# Patient Record
Sex: Male | Born: 1955 | Race: White | Hispanic: No | Marital: Married | Smoking: Never smoker
Health system: Southern US, Community
[De-identification: ages and names within clinical notes are randomized; demographics above are authoritative.]

## PROBLEM LIST (undated history)

## (undated) DIAGNOSIS — R112 Nausea with vomiting, unspecified: Secondary | ICD-10-CM

## (undated) DIAGNOSIS — Z9889 Other specified postprocedural states: Secondary | ICD-10-CM

## (undated) DIAGNOSIS — C801 Malignant (primary) neoplasm, unspecified: Secondary | ICD-10-CM

## (undated) DIAGNOSIS — J189 Pneumonia, unspecified organism: Secondary | ICD-10-CM

## (undated) DIAGNOSIS — F32A Depression, unspecified: Secondary | ICD-10-CM

## (undated) DIAGNOSIS — F329 Major depressive disorder, single episode, unspecified: Secondary | ICD-10-CM

## (undated) DIAGNOSIS — Z8489 Family history of other specified conditions: Secondary | ICD-10-CM

## (undated) DIAGNOSIS — E0590 Thyrotoxicosis, unspecified without thyrotoxic crisis or storm: Secondary | ICD-10-CM

## (undated) DIAGNOSIS — T4145XA Adverse effect of unspecified anesthetic, initial encounter: Secondary | ICD-10-CM

## (undated) DIAGNOSIS — T8859XA Other complications of anesthesia, initial encounter: Secondary | ICD-10-CM

## (undated) HISTORY — PX: RETINAL DETACHMENT SURGERY: SHX105

## (undated) HISTORY — PX: TONSILLECTOMY AND ADENOIDECTOMY: SUR1326

## (undated) HISTORY — DX: Depression, unspecified: F32.A

## (undated) HISTORY — PX: EYE SURGERY: SHX253

## (undated) HISTORY — PX: COLONOSCOPY: SHX174

## (undated) HISTORY — PX: WISDOM TOOTH EXTRACTION: SHX21

## (undated) HISTORY — DX: Thyrotoxicosis, unspecified without thyrotoxic crisis or storm: E05.90

## (undated) HISTORY — DX: Major depressive disorder, single episode, unspecified: F32.9

---

## 2000-02-04 ENCOUNTER — Encounter: Payer: Self-pay | Admitting: Internal Medicine

## 2000-02-04 ENCOUNTER — Ambulatory Visit (HOSPITAL_COMMUNITY): Admission: RE | Admit: 2000-02-04 | Discharge: 2000-02-04 | Payer: Self-pay | Admitting: Internal Medicine

## 2000-02-12 DIAGNOSIS — E049 Nontoxic goiter, unspecified: Secondary | ICD-10-CM | POA: Insufficient documentation

## 2000-03-25 ENCOUNTER — Encounter (INDEPENDENT_AMBULATORY_CARE_PROVIDER_SITE_OTHER): Payer: Self-pay | Admitting: Specialist

## 2000-03-25 ENCOUNTER — Ambulatory Visit (HOSPITAL_COMMUNITY): Admission: RE | Admit: 2000-03-25 | Discharge: 2000-03-25 | Payer: Self-pay | Admitting: Gastroenterology

## 2001-12-22 ENCOUNTER — Encounter: Admission: RE | Admit: 2001-12-22 | Discharge: 2001-12-22 | Payer: Self-pay | Admitting: Internal Medicine

## 2001-12-22 ENCOUNTER — Encounter: Payer: Self-pay | Admitting: Internal Medicine

## 2002-05-25 ENCOUNTER — Encounter: Payer: Self-pay | Admitting: Ophthalmology

## 2002-05-25 ENCOUNTER — Ambulatory Visit (HOSPITAL_COMMUNITY): Admission: RE | Admit: 2002-05-25 | Discharge: 2002-05-27 | Payer: Self-pay | Admitting: Ophthalmology

## 2004-02-22 ENCOUNTER — Ambulatory Visit: Payer: Self-pay | Admitting: Internal Medicine

## 2005-03-11 ENCOUNTER — Ambulatory Visit: Payer: Self-pay | Admitting: Internal Medicine

## 2005-03-19 ENCOUNTER — Encounter: Admission: RE | Admit: 2005-03-19 | Discharge: 2005-03-19 | Payer: Self-pay | Admitting: Internal Medicine

## 2005-04-20 ENCOUNTER — Ambulatory Visit: Payer: Self-pay | Admitting: Endocrinology

## 2005-04-20 ENCOUNTER — Other Ambulatory Visit: Admission: RE | Admit: 2005-04-20 | Discharge: 2005-04-20 | Payer: Self-pay | Admitting: Endocrinology

## 2005-04-20 ENCOUNTER — Encounter (INDEPENDENT_AMBULATORY_CARE_PROVIDER_SITE_OTHER): Payer: Self-pay | Admitting: *Deleted

## 2005-05-05 ENCOUNTER — Encounter (HOSPITAL_COMMUNITY): Admission: RE | Admit: 2005-05-05 | Discharge: 2005-07-21 | Payer: Self-pay | Admitting: Endocrinology

## 2005-05-14 ENCOUNTER — Ambulatory Visit: Payer: Self-pay | Admitting: Internal Medicine

## 2005-06-22 ENCOUNTER — Ambulatory Visit (HOSPITAL_COMMUNITY): Admission: RE | Admit: 2005-06-22 | Discharge: 2005-06-22 | Payer: Self-pay | Admitting: Endocrinology

## 2005-08-07 ENCOUNTER — Ambulatory Visit: Payer: Self-pay | Admitting: Endocrinology

## 2005-09-09 ENCOUNTER — Ambulatory Visit: Payer: Self-pay | Admitting: Endocrinology

## 2005-10-21 ENCOUNTER — Ambulatory Visit: Payer: Self-pay | Admitting: Endocrinology

## 2005-12-28 ENCOUNTER — Ambulatory Visit: Payer: Self-pay | Admitting: Internal Medicine

## 2006-01-11 ENCOUNTER — Ambulatory Visit (HOSPITAL_COMMUNITY): Admission: RE | Admit: 2006-01-11 | Discharge: 2006-01-11 | Payer: Self-pay | Admitting: Endocrinology

## 2006-02-24 ENCOUNTER — Ambulatory Visit: Payer: Self-pay | Admitting: Internal Medicine

## 2006-11-09 ENCOUNTER — Encounter: Payer: Self-pay | Admitting: Endocrinology

## 2006-11-29 ENCOUNTER — Encounter: Payer: Self-pay | Admitting: *Deleted

## 2007-02-23 ENCOUNTER — Ambulatory Visit: Payer: Self-pay | Admitting: Internal Medicine

## 2007-11-01 ENCOUNTER — Telehealth (INDEPENDENT_AMBULATORY_CARE_PROVIDER_SITE_OTHER): Payer: Self-pay | Admitting: *Deleted

## 2008-01-18 ENCOUNTER — Ambulatory Visit: Payer: Self-pay | Admitting: Internal Medicine

## 2008-03-23 ENCOUNTER — Ambulatory Visit: Payer: Self-pay | Admitting: Internal Medicine

## 2008-03-23 DIAGNOSIS — E0590 Thyrotoxicosis, unspecified without thyrotoxic crisis or storm: Secondary | ICD-10-CM | POA: Insufficient documentation

## 2008-03-23 DIAGNOSIS — E785 Hyperlipidemia, unspecified: Secondary | ICD-10-CM

## 2008-03-23 DIAGNOSIS — F319 Bipolar disorder, unspecified: Secondary | ICD-10-CM

## 2008-03-23 DIAGNOSIS — F528 Other sexual dysfunction not due to a substance or known physiological condition: Secondary | ICD-10-CM

## 2008-03-26 ENCOUNTER — Encounter (INDEPENDENT_AMBULATORY_CARE_PROVIDER_SITE_OTHER): Payer: Self-pay | Admitting: *Deleted

## 2008-03-27 ENCOUNTER — Telehealth (INDEPENDENT_AMBULATORY_CARE_PROVIDER_SITE_OTHER): Payer: Self-pay | Admitting: *Deleted

## 2008-03-28 ENCOUNTER — Encounter (INDEPENDENT_AMBULATORY_CARE_PROVIDER_SITE_OTHER): Payer: Self-pay | Admitting: *Deleted

## 2008-04-16 ENCOUNTER — Ambulatory Visit: Payer: Self-pay | Admitting: Internal Medicine

## 2008-05-03 ENCOUNTER — Ambulatory Visit: Payer: Self-pay | Admitting: Family Medicine

## 2008-05-03 DIAGNOSIS — J209 Acute bronchitis, unspecified: Secondary | ICD-10-CM

## 2008-05-03 LAB — CONVERTED CEMR LAB: Influenza B Ag: NEGATIVE

## 2008-07-26 ENCOUNTER — Telehealth: Payer: Self-pay | Admitting: Internal Medicine

## 2009-01-15 ENCOUNTER — Ambulatory Visit: Payer: Self-pay | Admitting: Internal Medicine

## 2009-05-30 ENCOUNTER — Telehealth (INDEPENDENT_AMBULATORY_CARE_PROVIDER_SITE_OTHER): Payer: Self-pay | Admitting: *Deleted

## 2009-08-05 ENCOUNTER — Ambulatory Visit: Payer: Self-pay | Admitting: Family Medicine

## 2009-08-05 DIAGNOSIS — J019 Acute sinusitis, unspecified: Secondary | ICD-10-CM

## 2010-02-05 ENCOUNTER — Ambulatory Visit: Payer: Self-pay | Admitting: Internal Medicine

## 2010-03-18 ENCOUNTER — Ambulatory Visit: Payer: Self-pay | Admitting: Family Medicine

## 2010-03-18 DIAGNOSIS — J069 Acute upper respiratory infection, unspecified: Secondary | ICD-10-CM | POA: Insufficient documentation

## 2010-05-04 ENCOUNTER — Encounter: Payer: Self-pay | Admitting: Endocrinology

## 2010-05-11 LAB — CONVERTED CEMR LAB
ALT: 49 units/L (ref 0–53)
AST: 31 units/L (ref 0–37)
Albumin: 4 g/dL (ref 3.5–5.2)
Alkaline Phosphatase: 60 units/L (ref 39–117)
BUN: 14 mg/dL (ref 6–23)
Basophils Relative: 0 % (ref 0.0–3.0)
CO2: 28 meq/L (ref 19–32)
Chloride: 106 meq/L (ref 96–112)
Creatinine, Ser: 0.9 mg/dL (ref 0.4–1.5)
Eosinophils Relative: 1.2 % (ref 0.0–5.0)
GFR calc non Af Amer: 94 mL/min
Glucose, Bld: 94 mg/dL (ref 70–99)
HDL goal, serum: 40 mg/dL
HDL: 42.4 mg/dL (ref >39.0)
Lymphocytes Relative: 27.9 % (ref 12.0–46.0)
MCV: 89.9 fL (ref 78.0–100.0)
Monocytes Relative: 9.1 % (ref 3.0–12.0)
Neutrophils Relative %: 61.8 % (ref 43.0–77.0)
PSA: 0.57 ng/mL (ref 0.10–4.00)
Platelets: 194 10*3/uL (ref 150–400)
Potassium: 4 meq/L (ref 3.5–5.1)
RBC: 4.97 M/uL (ref 4.22–5.81)
Total CHOL/HDL Ratio: 4.6
Total Protein: 6.4 g/dL (ref 6.0–8.3)
VLDL: 17 mg/dL (ref 0–40)
WBC: 6.1 10*3/uL (ref 4.5–10.5)

## 2010-05-13 NOTE — Assessment & Plan Note (Signed)
Summary: congested,cough/cbs   Vital Signs:  Patient profile:   55 year old male Height:      70 inches Weight:      191 pounds BMI:     27.50 Temp:     97.7 degrees F oral Pulse rate:   76 / minute Pulse rhythm:   regular BP sitting:   124 / 82  (left arm) Cuff size:   large  Vitals Entered By: Army Fossa CMA (August 05, 2009 1:36 PM) CC: Pt here for nasal and chest congestion x 1 week. Has tried Zyrtec., URI symptoms   History of Present Illness:       This is a 55 year old man who presents with URI symptoms.  The symptoms began 1 week ago.  The patient complains of nasal congestion, purulent nasal discharge, and sick contacts, but denies clear nasal discharge, sore throat, dry cough, productive cough, and earache.  The patient denies fever, low-grade fever (<100.5 degrees), fever of 100.5-103 degrees, fever of 103.1-104 degrees, fever to >104 degrees, stiff neck, dyspnea, wheezing, rash, vomiting, diarrhea, use of an antipyretic, and response to antipyretic.  The patient denies itchy watery eyes, itchy throat, sneezing, seasonal symptoms, response to antihistamine, headache, muscle aches, and severe fatigue.  The patient denies the following risk factors for Strep sinusitis: unilateral facial pain, unilateral nasal discharge, poor response to decongestant, double sickening, tooth pain, Strep exposure, tender adenopathy, and absence of cough.    Current Medications (verified): 1)  Wellbutrin Sr 150 Mg  Tb12 (Bupropion Hcl) .... Take 1 By Mouth Two Times A Day Qd 2)  Lithobid 300 Mg  Tbcr (Lithium Carbonate) .... Take 1 By Mouth Qd 3)  Fish Oil 300 Mg Caps (Omega-3 Fatty Acids) .Marland Kitchen.. 1 By Mouth Once Daily 4)  Fish Oil  Oil (Fish Oil) .... 3600mg  Once Daily 5)  Cinnamon 500 Mg Tabs (Cinnamon) .... 1000mg  Once Daily 6)  Calcium 600 600 Mg Tabs (Calcium Carbonate) .... 1200mg  Once Daily 7)  Lecithin 900mg  .... 1 By Mouth Once Daily 8)  Ester-C  Cr-Tabs (Bioflavonoid Products) ....  1000mg  Once Daily 9)  Ra Chromium Picolinate 400 Mcg Tabs (Chromium Picolinate) .Marland Kitchen.. 1 By Mouth Once Daily 10)  Saw Palmetto Plus  Caps (Misc Natural Products) .... 900mg  Once Daily 11)  Adult Aspirin Low Strength 81 Mg Tbdp (Aspirin) .Marland Kitchen.. 1 By Mouth Once Daily 12)  Milk Thistle Xtra  Caps (Milk Thistle-Dand-Fennel-Licor) .... 2000mg  Once Daily 13)  Magnesium 800mg  .... 1 By Mouth Once Daily 14)  Multivitamins  Tabs (Multiple Vitamin) .Marland Kitchen.. 1 By Mouth Once Daily 15)  Vitamin D3 1000 Unit Tabs (Cholecalciferol) .Marland Kitchen.. 1 By Mouth Once Daily 16)  Lycopene 10 Mg Caps (Lycopene) .Marland Kitchen.. 1 By Mouth Once Daily 17)  Super B Complex  Tabs (B Complex-C) .Marland Kitchen.. 1 By Mouth Once Daily 18)  Zinc 50 Mg Tabs (Zinc) .Marland Kitchen.. 1 By Mouth Once Daily 19)  Acai 500 Mg Caps (Acai) .... 2 Grams Daily 20)  Doxycycline Hyclate 100 Mg Caps (Doxycycline Hyclate) .Marland Kitchen.. 1 By Mouth Two Times A Day 21)  Veramyst 27.5 Mcg/spray Susp (Fluticasone Furoate) .... 2 Sprays Each Nostril Once Daily  Allergies: 1)  ! Penicillin  Past History:  Past medical, surgical, family and social histories (including risk factors) reviewed for relevance to current acute and chronic problems.  Past Medical History: Reviewed history from 03/23/2008 and no changes required. 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 T & A Skin Lesion removed from eyelid ("unremarkable")  I-131 Therapy (06/22/2005);Fine needle biopsy X3 in 2007; Detached retina, lens implant; Colonoscopy neg 2001  Family History: Reviewed history from 03/23/2008 and no changes required. Father: bladder disease Mother: Tb,vertigo Siblings: neg; P uncle colon CA;M uncle colon CA;MGF colon CA  Social History: Reviewed history from 03/23/2008 and no changes required. Occupation: Veterinary surgeon Married Never Smoked Alcohol  use-no Regular exercise-yes  Review of Systems      See HPI  Physical Exam  General:  Well-developed,well-nourished,in no acute distress; alert,appropriate and cooperative throughout examination Ears:  External ear exam shows no significant lesions or deformities.  Otoscopic examination reveals clear canals, tympanic membranes are intact bilaterally without bulging, retraction, inflammation or discharge. Hearing is grossly normal bilaterally. Nose:  L frontal sinus tenderness, L maxillary sinus tenderness, R frontal sinus tenderness, and R maxillary sinus tenderness.   Mouth:  Oral mucosa and oropharynx without lesions or exudates.  Teeth in good repair. Neck:  No deformities, masses, or tenderness noted. Lungs:  Normal respiratory effort, chest expands symmetrically. Lungs are clear to auscultation, no crackles or wheezes. Heart:  normal rate and no murmur.   Skin:  Intact without suspicious lesions or rashes Cervical Nodes:  No lymphadenopathy noted Psych:  Cognition and judgment appear intact. Alert and cooperative with normal attention span and concentration. No apparent delusions, illusions, hallucinations   Impression & Recommendations:  Problem # 1:  SINUSITIS - ACUTE-NOS (ICD-461.9)  His updated medication list for this problem includes:    Doxycycline Hyclate 100 Mg Caps (Doxycycline hyclate) .Marland Kitchen... 1 by mouth two times a day    Veramyst 27.5 Mcg/spray Susp (Fluticasone furoate) .Marland Kitchen... 2 sprays each nostril once daily     con't mucinex / zyrtec as needed neti pot   Instructed on treatment. Call if symptoms persist or worsen.   Complete Medication List: 1)  Wellbutrin Sr 150 Mg Tb12 (Bupropion hcl) .... Take 1 by mouth two times a day qd 2)  Lithobid 300 Mg Tbcr (Lithium carbonate) .... Take 1 by mouth qd 3)  Fish Oil 300 Mg Caps (Omega-3 fatty acids) .Marland Kitchen.. 1 by mouth once daily 4)  Fish Oil Oil (Fish oil) .... 3600mg  once daily 5)  Cinnamon 500 Mg Tabs (Cinnamon) ....  1000mg  once daily 6)  Calcium 600 600 Mg Tabs (Calcium carbonate) .... 1200mg  once daily 7)  Lecithin 900mg   .... 1 by mouth once daily 8)  Ester-c Cr-tabs (Bioflavonoid products) .... 1000mg  once daily 9)  Ra Chromium Picolinate 400 Mcg Tabs (Chromium picolinate) .Marland Kitchen.. 1 by mouth once daily 10)  Saw Palmetto Plus Caps (Misc natural products) .... 900mg  once daily 11)  Adult Aspirin Low Strength 81 Mg Tbdp (Aspirin) .Marland Kitchen.. 1 by mouth once daily 12)  Milk Thistle Xtra Caps (Milk thistle-dand-fennel-licor) .... 2000mg  once daily 13)  Magnesium 800mg   .... 1 by mouth once daily 14)  Multivitamins Tabs (Multiple vitamin) .Marland Kitchen.. 1 by mouth once daily 15)  Vitamin D3 1000 Unit Tabs (Cholecalciferol) .Marland Kitchen.. 1 by mouth once daily 16)  Lycopene 10 Mg Caps (Lycopene) .Marland Kitchen.. 1 by mouth once daily 17)  Super B Complex Tabs (B complex-c) .Marland Kitchen.. 1 by mouth once daily 18)  Zinc 50 Mg Tabs (Zinc) .Marland Kitchen.. 1 by mouth once daily 19)  Acai 500 Mg Caps (Acai) .... 2 grams daily 20)  Doxycycline Hyclate 100 Mg Caps (Doxycycline hyclate) .Marland Kitchen.. 1 by mouth two times a  day 21)  Veramyst 27.5 Mcg/spray Susp (Fluticasone furoate) .... 2 sprays each nostril once daily Prescriptions: DOXYCYCLINE HYCLATE 100 MG CAPS (DOXYCYCLINE HYCLATE) 1 by mouth two times a day  #20 x 0   Entered and Authorized by:   Loreen Freud DO   Signed by:   Loreen Freud DO on 08/05/2009   Method used:   Electronically to        [id number] (retail)       [address]       [place], [place]  [address]       Ph: [phone] or [phone]       Fax: [phone]   RxID:   [id number]

## 2010-05-13 NOTE — Progress Notes (Signed)
Summary: nausea, vomiting  Phone Note Call from Patient   Caller: Patient Summary of Call: pt c/o nausea, vomiting x1day. pt denies any fever. pt states that he is unable to keep anything down and has now began to vomit the linen of his stomach. pt advise ED for possible risk of dehydration pt refused stating that he would prefer to come in to office to be seen. pt schedule for OV today but advise ED if symptoms worsen............Marland KitchenFelecia Deloach CMA  May 30, 2009 9:11 AM

## 2010-05-13 NOTE — Assessment & Plan Note (Signed)
Summary: flu-like symptoms/cbs   Vital Signs:  Patient profile:   55 year old male Height:      70 inches Weight:      181 pounds O2 Sat:      96 % on Room air Temp:     97.7 degrees F oral Pulse rate:   62 / minute BP sitting:   118 / 78  (right arm)  Vitals Entered By: Jeremy Johann CMA (March 18, 2010 2:07 PM)  O2 Flow:  Room air CC: head congestion, dry cough, drainage, fatigue   History of Present Illness: 55 yo man here today for head congestion.  sxs started Sunday w/ sore throat, dry cough, congestion.  taking advil and sudafed w/ temporary relief.  denies facial pain, + body aches and fatigue.  + sick contacts.  no ear pain.  pt then states that he has had congestion for 'the last 6 weeks, this is not a virus'.  wife is w/ pt, reports she had similar sxs 3 weeks ago and 'needed a zpack'.  they are very clear that they feel he needs abx today.  Current Medications (verified): 1)  Wellbutrin Sr 150 Mg  Tb12 (Bupropion Hcl) .... Take 1 By Mouth Two Times A Day Qd 2)  Lithobid 300 Mg  Tbcr (Lithium Carbonate) .... Take 1 By Mouth Qd 3)  Fish Oil 300 Mg Caps (Omega-3 Fatty Acids) .Marland Kitchen.. 1 By Mouth Once Daily 4)  Fish Oil  Oil (Fish Oil) .... 3600mg  Once Daily 5)  Cinnamon 500 Mg Tabs (Cinnamon) .... 1000mg  Once Daily 6)  Calcium 600 600 Mg Tabs (Calcium Carbonate) .... 1200mg  Once Daily 7)  Lecithin 900mg  .... 1 By Mouth Once Daily 8)  Ester-C  Cr-Tabs (Bioflavonoid Products) .... 1000mg  Once Daily 9)  Ra Chromium Picolinate 400 Mcg Tabs (Chromium Picolinate) .Marland Kitchen.. 1 By Mouth Once Daily 10)  Saw Palmetto Plus  Caps (Misc Natural Products) .... 900mg  Once Daily 11)  Adult Aspirin Low Strength 81 Mg Tbdp (Aspirin) .Marland Kitchen.. 1 By Mouth Once Daily 12)  Milk Thistle Xtra  Caps (Milk Thistle-Dand-Fennel-Licor) .... 2000mg  Once Daily 13)  Magnesium 800mg  .... 1 By Mouth Once Daily 14)  Multivitamins  Tabs (Multiple Vitamin) .Marland Kitchen.. 1 By Mouth Once Daily 15)  Vitamin D3 1000 Unit Tabs  (Cholecalciferol) .Marland Kitchen.. 1 By Mouth Once Daily 16)  Lycopene 10 Mg Caps (Lycopene) .Marland Kitchen.. 1 By Mouth Once Daily 17)  Super B Complex  Tabs (B Complex-C) .Marland Kitchen.. 1 By Mouth Once Daily 18)  Zinc 50 Mg Tabs (Zinc) .Marland Kitchen.. 1 By Mouth Once Daily 19)  Acai 500 Mg Caps (Acai) .... 2 Grams Daily  Allergies (verified): 1)  ! Penicillin  Review of Systems      See HPI  Physical Exam  General:  Well-developed,well-nourished,in no acute distress; alert,appropriate and cooperative throughout examination Head:  NCAT, no TTP over sinuses Eyes:  no injxn or inflammation Ears:  External ear exam shows no significant lesions or deformities.  Otoscopic examination reveals clear canals, tympanic membranes are intact bilaterally without bulging, retraction, inflammation or discharge. Hearing is grossly normal bilaterally. Nose:  + congestion Mouth:  + PND Neck:  No deformities, masses, or tenderness noted. Lungs:  Normal respiratory effort, chest expands symmetrically. Lungs are clear to auscultation, no crackles or wheezes.  + dry cough   Impression & Recommendations:  Problem # 1:  URI (ICD-465.9) Assessment New  no obvious evidence of bacterial infxn on PE but pt and wife adament that he  needs abx.  start Tessalon as needed for cough.  Zpack.  reviewed supportive care and red flags that should prompt return.  Pt expresses understanding and is in agreement w/ this plan. His updated medication list for this problem includes:    Adult Aspirin Low Strength 81 Mg Tbdp (Aspirin) .Marland Kitchen... 1 by mouth once daily    Tessalon 200 Mg Caps (Benzonatate) .Marland Kitchen... Take one capsule by mouth three times a day as needed for cough  Orders: Prescription Created Electronically [phone])  Complete Medication List: 1)  Wellbutrin Sr 150 Mg Tb12 (Bupropion hcl) .... Take 1 by mouth two times a day qd 2)  Lithobid 300 Mg Tbcr (Lithium carbonate) .... Take 1 by mouth qd 3)  Fish Oil 300 Mg Caps (Omega-3 fatty acids) .Marland Kitchen.. 1 by mouth once  daily 4)  Fish Oil Oil (Fish oil) .... 3600mg  once daily 5)  Cinnamon 500 Mg Tabs (Cinnamon) .... 1000mg  once daily 6)  Calcium 600 600 Mg Tabs (Calcium carbonate) .... 1200mg  once daily 7)  Lecithin 900mg   .... 1 by mouth once daily 8)  Ester-c Cr-tabs (Bioflavonoid products) .... 1000mg  once daily 9)  Ra Chromium Picolinate 400 Mcg Tabs (Chromium picolinate) .Marland Kitchen.. 1 by mouth once daily 10)  Saw Palmetto Plus Caps (Misc natural products) .... 900mg  once daily 11)  Adult Aspirin Low Strength 81 Mg Tbdp (Aspirin) .Marland Kitchen.. 1 by mouth once daily 12)  Milk Thistle Xtra Caps (Milk thistle-dand-fennel-licor) .... 2000mg  once daily 13)  Magnesium 800mg   .... 1 by mouth once daily 14)  Multivitamins Tabs (Multiple vitamin) .Marland Kitchen.. 1 by mouth once daily 15)  Vitamin D3 1000 Unit Tabs (Cholecalciferol) .Marland Kitchen.. 1 by mouth once daily 16)  Lycopene 10 Mg Caps (Lycopene) .Marland Kitchen.. 1 by mouth once daily 17)  Super B Complex Tabs (B complex-c) .Marland Kitchen.. 1 by mouth once daily 18)  Zinc 50 Mg Tabs (Zinc) .Marland Kitchen.. 1 by mouth once daily 19)  Acai 500 Mg Caps (Acai) .... 2 grams daily 20)  Azithromycin 250 Mg Tabs (Azithromycin) .... 2 by  mouth today and then 1 daily for 4 days 21)  Tessalon 200 Mg Caps (Benzonatate) .... Take one capsule by mouth three times a day as needed for cough  Patient Instructions: 1)  Take the Azithromycin as directed 2)  Use the Tessalon for cough 3)  Continue the Sudafed as needed for congestion- no more than 3-5 days 4)  Add Mucinex as needed to thin congestion in head and chest 5)  Lots of fluids! 6)  Rest! 7)  Tylenol/Ibuprofen as needed for pain or fever 8)  Hang in there!!! Prescriptions: TESSALON 200 MG CAPS (BENZONATATE) Take one capsule by mouth three times a day as needed for cough  #60 x 0   Entered and Authorized by:   Neena Rhymes MD   Signed by:   Neena Rhymes MD on 03/18/2010   Method used:   Electronically to        [id number]       [address]        [place], [place]  [address]       Ph: [phone] or [phone]       Fax: [phone]   RxID:   [id number] AZITHROMYCIN 250 MG  TABS (AZITHROMYCIN) 2 by  mouth today and then 1 daily for 4 days  #6 x 0   Entered and Authorized by:   Neena Rhymes MD   Signed by:   Neena Rhymes MD on  03/18/2010   Method used:   Electronically to        [id number] ([facility]       [address]       [place], [place]  [address]       Ph: [phone] or [phone]       Fax: [phone]   RxID:   [id number]    Orders Added: 1)  Est. Patient Level III [27253] 2)  Prescription Created Electronically [phone]

## 2010-05-13 NOTE — Assessment & Plan Note (Signed)
Summary: FLU SHOT.CBS  Nurse Visit  CC: Flu shot./kb   Allergies: 1)  ! Penicillin  Orders Added: 1)  Flu Vaccine 54yrs + MEDICARE PATIENTS [Q2039] 2)  Administration Flu vaccine - MCR [G0008]        Flu Vaccine Consent Questions     Do you have a history of severe allergic reactions to this vaccine? no    Any prior history of allergic reactions to egg and/or gelatin? no    Do you have a sensitivity to the preservative Thimersol? no    Do you have a past history of Guillan-Barre Syndrome? no    Do you currently have an acute febrile illness? no    Have you ever had a severe reaction to latex? no    Vaccine information given and explained to patient? yes    Are you currently pregnant? no    Lot Number:[id number]   Exp Date:10/11/2010   Site Given  Left Deltoid IMu

## 2010-08-29 NOTE — Op Note (Signed)
NAME:  Patrick Webster, Patrick Webster                           ACCOUNT NO.:  [id number]   MEDICAL RECORD NO.:  [id number]                   PATIENT TYPE:  OIB   LOCATION:  2899                                 FACILITY:  [facility]   PHYSICIAN:  Lanna Poche, M.D.              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   PROCEDURE:  1. Pars plana vitrectomy.  2. Scleral buckle gas-fluid exchange with 16% C3F8 and laser for retinal     breaks, right eye.   SURGEON:  Lanna Poche, M.D.   ASSISTANTWestly Pam.   ANESTHESIA:  General endotracheal anesthesia.   ESTIMATED BLOOD LOSS:  Less than  1 cc.   COMPLICATIONS:  None.   DESCRIPTION OF PROCEDURE:  The patient was taken to the operating room and  after the induction of general anesthesia the right eye was prepped and  draped in the usual sterile fashion. The lid speculum was introduced and an  conjunctival peritomy was about 360 degrees at the limbus with the last  incisions at 3 and 9. Hemostasis was obtained with the electrocautery.  Sclerotomies were fashioned at 3.75 mm posterior to the limbus at 1:30,  10:30 and 7:30. These suprasclerotomies were plugged and a 4-mm fusion  cannula secured at the 7:30 sclerotomy with a temporary suture of 7-0  Vicryl. The tip was visually inspected and found to be in the vitreous  space. A Landers ring was secured to the above with 7-0 Vicryl sutures at 3  and 9. The plugs were removed and the prismatic lens was applied to the  surface of the eye. Using the [facility] vitreous shaver, the central vitreous  space was removed, and then trimmed 360  degrees. Additional sterile  depression was used to trim the vitreous space over the area of the holes  inferiorly as well as the areas of detachment temporally and superiorly. No  iatrogenic holes were made. The instrument was removed from the eyes and the  hole was plugged. The Landers lens and ring were removed. The plugs were  then removed and temporary sutures of 7-0 Vicryl were placed in the superior  sclerotomy. The 4 rectus muscles were isolated and placed on traction  sutures of 2-0 silk. Inspection with the intra ophthalmoscope of the scleral  depression revealed there to be no additional retinal breaks or tears other  than those that had been noted preoperatively. The indirect laser  ophthalmoscope was then used with the aid of scleral depression to achieve  good retinal widening around the retinal breaks inferiorly as well  as the  small atrophic tear temporally and the attached retina nasally. The  subretinal fluid precluded the complete photocoagulation around the large  gaping horseshoe tear superiorly, but the superotemporal sclerotomy was left  closed with the superonasal sclerotomy open. The silicone tipped catheter  was then passed through the sclerotomy and then a gas-fluid exchange was  performed under indirect ophthalmoscopy. The superonasal sclerotomy was then  reclosed, and having  achieved good apposition of the superior retina, laser  photocoagulation was applied around this large hole 360 degrees without  difficulty. A small amount of fluid could be seen posteriorly, trapped but  not into the macula. The lid speculum was then removed and the traction  sutures that were placed with 2-0 silk in the upper and lower lids.  A 240  band with a 277 buckle was passed 360 degrees around  the recti muscles,  again joining the superonasal quadrant with a 270 sleeve. Sutures of 4-0  Mersilene were placed rotated posteriorly with 2 sutures in the temporal  quadrants, 1 suture  in the superonasal and inferior nasal quadrants without  complications. A single anchoring suture of 5-0 Mersilene was placed over  the band that is inferior to the medial rectus muscle. All  knots were tied  and rotated posteriorly. Inspection with the ophthalmoscope showed there to  be good buckling effect with good support of all retinal breaks. A mixture  of C3F8 was drawn up in the usual sterile fashion. Then 20 cc were infused  with  an infusing pen with egress to the superotemporal sclerotomy. The  superotemporal sclerotomy was then closed. A small amount of gas was seen to  be escaping from the superonasal sclerotomy and this was oversewn with  singling of the small leak. The pressure was adjusted  and then the infusion  cannula was removed and the preplaced sutures were secured. The pressure was  rechecked and found to be precisely at 21 mmHg. The traction sutures of 2-0  and 4-0 silk were then removed and the lid speculum was reintroduced. The  conjunctiva was dropped and reapproximated with interrupted running sutures  of 6-0 plain gut. The subconjunctival space was irrigated with 0.75%  Marcaine as well as mixed antibiotic solution. The subconjunctival space was  then injected with 100 mg of ceftazidime and 10 mg of Decadron. The lid  speculum was then removed and a mixed antibiotic ointment was placed on the  surface of the eye. An eye patch and shield were then placed over the  patient's right eye. Upon awakening from anesthesia the patient left the  operating room in stable condition.                                               Lanna Poche, M.D.    JTH/MEDQ  D:  05/25/2002  T:  05/26/2002  Job:  [id number]   cc:   Waynetta Sandy A. Sheppard Penton, M.D.  [address]  [place] [address]  Fax: [phone]

## 2010-08-29 NOTE — Procedures (Signed)
[facility]  Patient:    Patrick Webster, Patrick Webster                        MRN: [id number] Proc. Date: 03/25/00 Adm. Date:  [id number] Disc. Date: [id number] Attending:  Charmaine Downs                           Procedure Report  REPEAT DICTATION  PROCEDURE:  Colonoscopic examination.  INDICATIONS FOR PROCEDURE:  A patient with a mother and father having colon cancer, thusly it was felt important to have a surveillance evaluation of his colon as well.  ANESTHESIA:  The patient was premedicated with 9 mg of Versed and 87.5 mg of fentanyl IV.  DESCRIPTION OF PROCEDURE:  The Olympus colonoscope was advanced through the entire colon into the terminal ileum. On retraction ______ inspection of the colon failed to reveal any mucosal lesions of significance. A biopsy was obtained from the terminal ileum and rectum and these were reported as unremarkable. There was no evidence of inflammation or changes consistent with granuloma or an inflammatory process. Rectal examination was unremarkable.  IMPRESSION:  Rectum to cecum essentially normal examination, I think this patient should have annual colon screens and repeat colonoscopic examination in five years unless there is a change in bowel habits or evidence of bleeding. DD:  07/15/00 TD:  07/15/00 Job: [id number]

## 2011-03-24 ENCOUNTER — Ambulatory Visit (INDEPENDENT_AMBULATORY_CARE_PROVIDER_SITE_OTHER): Payer: Medicare Other

## 2011-03-24 DIAGNOSIS — Z23 Encounter for immunization: Secondary | ICD-10-CM

## 2011-04-08 ENCOUNTER — Encounter: Payer: Self-pay | Admitting: Internal Medicine

## 2011-04-08 ENCOUNTER — Ambulatory Visit (INDEPENDENT_AMBULATORY_CARE_PROVIDER_SITE_OTHER): Payer: Medicare Other | Admitting: Internal Medicine

## 2011-04-08 VITALS — BP 124/80 | HR 90 | Temp 98.8°F | Wt 178.8 lb

## 2011-04-08 DIAGNOSIS — R509 Fever, unspecified: Secondary | ICD-10-CM

## 2011-04-08 DIAGNOSIS — M791 Myalgia, unspecified site: Secondary | ICD-10-CM

## 2011-04-08 DIAGNOSIS — M2550 Pain in unspecified joint: Secondary | ICD-10-CM

## 2011-04-08 DIAGNOSIS — IMO0001 Reserved for inherently not codable concepts without codable children: Secondary | ICD-10-CM

## 2011-04-08 LAB — POCT INFLUENZA A/B: Influenza B, POC: NEGATIVE

## 2011-04-08 MED ORDER — OSELTAMIVIR PHOSPHATE 75 MG PO CAPS: 75.0000 mg | ORAL_CAPSULE | Freq: Two times a day (BID) | ORAL | Status: AC

## 2011-04-08 NOTE — Patient Instructions (Addendum)
Zicam Melts or Zinc lozenges ; vitamin C 2000 mg daily; & Echinacea for 4-7 days. Report fever, exudate("pus") or progressive pain.  Plain Mucinex for thick secretions ;force NON dairy fluids for next 48 hrs. Use a Neti pot daily as needed for sinus congestion NSAIDS ( Aleve, Advil, Naproxen) or Tylenol every 4 hrs as needed for fever as discussed based on label recommendations

## 2011-04-08 NOTE — Progress Notes (Signed)
  Subjective:    Patient ID: Patrick Webster, male    DOB: 1956/02/22, 55 y.o.   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 Progression of symptoms:head congestion as of 12/25 eve Treatments/response:Advil, Dayquil w/o benefit Present symptoms: Fever/chills/sweats:chills Frontal headache:no Facial pain:no Nasal purulence:no Sore throat:minor Dental pain:in jaws Lymphadenopathy:no Wheezing/shortness of breath:no Cough/sputum/hemoptysis:NP cough today Associated extrinsic/allergic symptoms:itchy eyes/ sneezing:no Past medical history: Seasonal allergies; no/asthma:no Smoking history:never           Review of Systems     Objective:   Physical Exam General appearance is of good health and nourishment; no acute distress or increased work of breathing is present.  No  lymphadenopathy about the head, neck, or axilla noted.   Eyes: No conjunctival inflammation or lid edema is present. There is no scleral icterus. Asymmetry of lenses ; post op changesOD  Ears:  External ear exam shows no significant lesions or deformities.  Otoscopic examination reveals wax on R; L TM normal Nose:  External nasal examination shows no deformity or inflammation. Nasal mucosa are boggy  without lesions or exudates. No septal dislocation .No obstruction to airflow.   Oral exam: Dental hygiene is good; lips and gums are healthy appearing.There is no oropharyngeal erythema or exudate noted.   Neck:   Supple with full range of motion without pain.   Heart:  Normal rate and regular rhythm. S1 and S2 normal without gallop, murmur, click, rub or other extra sounds.   Lungs:Chest clear to auscultation; no wheezes, rhonchi,rales ,or rubs present.No increased work of breathing.  Abdomen: Normal bowel sounds; no tenderness or masses present   Extremities:  No cyanosis, edema, or  clubbing  noted . Straight leg raising is negative; no meningismus present   Skin: Warm & dry w/o jaundice or tenting.          Assessment & Plan:   #1 febrile illness with nausea vomiting which is resolved. Clinically viral process suggested; influenza nasal swab is negative.  Plan: See orders and recommendations.

## 2011-04-09 LAB — CBC WITH DIFFERENTIAL/PLATELET
Basophils Relative: 0.5 % (ref 0.0–3.0)
Eosinophils Relative: 1 % (ref 0.0–5.0)
HCT: 45.3 % (ref 39.0–52.0)
Lymphs Abs: 1.1 10*3/uL (ref 0.7–4.0)
Monocytes Relative: 17 % — ABNORMAL HIGH (ref 3.0–12.0)
Neutrophils Relative %: 49.6 % (ref 43.0–77.0)
Platelets: 192 10*3/uL (ref 150.0–400.0)
RBC: 5.11 Mil/uL (ref 4.22–5.81)
WBC: 3.4 10*3/uL — ABNORMAL LOW (ref 4.5–10.5)

## 2011-04-10 ENCOUNTER — Telehealth: Payer: Self-pay | Admitting: *Deleted

## 2011-04-10 MED ORDER — FLUTICASONE PROPIONATE 50 MCG/ACT NA SUSP: 1.0000 | Freq: Two times a day (BID) | NASAL | Status: DC

## 2011-04-10 MED ORDER — AZITHROMYCIN 250 MG PO TABS: ORAL_TABLET | ORAL | Status: AC

## 2011-04-10 NOTE — Telephone Encounter (Signed)
Zpak per Dr Alwyn Ren to pharmacy. Patient informed.

## 2011-04-10 NOTE — Telephone Encounter (Signed)
Pt was in office 12.26.12 w/flu-like Sxs, given tamiflu, and reports that he was told to call back if fever returned. Pt c/o last night & this Am having severe congestion & fever of 100; would like to know your recommendation [OV or new Rx?].

## 2011-04-10 NOTE — Telephone Encounter (Signed)
Plain Mucinex for thick secretions ;force NON dairy fluids for next 48 hrs. Use a Neti pot daily as needed for sinus congestion & generic Flonase 1 spray in each nostril twice a day as needed. Use the "crossover" technique (hold in R hand & spray into L nostril , pointing toward L ear; repeat into L nostril in same fashion, ie point toward ear , not straight up). CBC & dif does not suggest bacterial infection.NSAIDS ( Aleve, Advil, Naproxen) or Tylenol every 4 hrs as needed for fever as discussed based on label recommendations .Report yellow or green nasal diascharge

## 2011-04-10 NOTE — Telephone Encounter (Signed)
Patient informed. Rx sent to pharmacy for fluticasone. Pt now reports that chest congestion has started today w/production of yellow mucus. Please advise.

## 2011-05-05 ENCOUNTER — Encounter: Payer: Self-pay | Admitting: Internal Medicine

## 2011-05-05 ENCOUNTER — Ambulatory Visit (INDEPENDENT_AMBULATORY_CARE_PROVIDER_SITE_OTHER): Payer: Medicare Other | Admitting: Internal Medicine

## 2011-05-05 VITALS — BP 130/76 | HR 79 | Temp 98.2°F | Resp 14 | Ht 70.0 in | Wt 177.8 lb

## 2011-05-05 DIAGNOSIS — E785 Hyperlipidemia, unspecified: Secondary | ICD-10-CM

## 2011-05-05 DIAGNOSIS — E049 Nontoxic goiter, unspecified: Secondary | ICD-10-CM

## 2011-05-05 DIAGNOSIS — R6889 Other general symptoms and signs: Secondary | ICD-10-CM

## 2011-05-05 DIAGNOSIS — Z0000 Encounter for general adult medical examination without abnormal findings: Secondary | ICD-10-CM

## 2011-05-05 NOTE — Patient Instructions (Addendum)
Please  schedule fasting Labs : BMET,Lipids, hepatic panel,CBC & dif,  TSH,free T4, PSA. PLEASE BRING THESE INSTRUCTIONS TO FOLLOW UP  LAB APPOINTMENT.This will guarantee correct labs are drawn, eliminating need for repeat blood sampling ( needle sticks ! ). Diagnoses /Codes:272.4, 796.4,241.9,780.79  As per the Standard of Care , screening Colonoscopy recommended @ 50 & every 5-10 years thereafter . More frequent monitor would be dictated by family history or findings @ Colonoscopy

## 2011-05-05 NOTE — Assessment & Plan Note (Signed)
Ultrasound and thyroid function tests are indicated

## 2011-05-05 NOTE — Progress Notes (Signed)
Subjective:    Patient ID: Patrick Webster, male    DOB: 1955/11/26, 56 y.o.   MRN: [id number]  HPI Medicare Wellness Visit:  The following psychosocial & medical history were reviewed as required by Medicare.   Social history: caffeine: 2 cups of coffee/ day , alcohol:  no ,  tobacco use : never  & exercise : dancing 5X/ week.   Home & personal  safety / fall risk: no issues, activities of daily living: no limitations , seatbelt use : yes , and smoke alarm employment : yes .  Power of Attorney/Living Will status :in place  Vision ( as recorded per Nurse) & Hearing  evaluation :  Ophth exam due Spring;decrreased vision to wall chart OD; whisper heard @ 6 ft. Orientation :oriented X 3 , memory & recall :good,  math testing: good,and mood & affect :normal  . Depression / anxiety: denied , seeing Dr Ladona Mow Travel history : [place] 2012 , immunization status :Shingles needed , transfusion history:  no, and preventive health surveillance ( colonoscopies, BMD , etc as per protocol/ SOC):colonoscopy due , Dental care:  annually . Chart reviewed &  Updated. Active issues reviewed & addressed.       Review of Systems Patient reports no  hearing changes,anorexia, weight change, fever ,adenopathy, persistant / recurrent hoarseness, swallowing issues, chest pain,palpitations, edema,persistant / recurrent cough, hemoptysis, dyspnea(rest, exertional, paroxysmal nocturnal), gastrointestinal  bleeding (melena, rectal bleeding), abdominal pain, excessive heart burn, GU symptoms( dysuria, hematuria, pyuria, voiding/incontinence  issues) syncope, focal weakness, memory loss,numbness & tingling, skin/hair/nail changes, abnormal bruising/bleeding,or  musculoskeletal symptoms/signs.      Objective:   Physical Exam Gen.: Thin but healthy and well-nourished in appearance. Alert, appropriate and cooperative throughout exam. Head: Normocephalic without obvious abnormalities  Eyes: No corneal or conjunctival  inflammation noted. Pupils unequal ;OD pupil with post op changes.  Extraocular motion intact.  Ears: External  ear exam reveals no significant lesions or deformities. Canals ;wax on R; L normal.  Nose: External nasal exam reveals no deformity or inflammation. Nasal mucosa are pink and moist. No lesions or exudates noted.  Mouth: Oral mucosa and oropharynx reveal no lesions or exudates. Teeth in good repair. Neck: No deformities, masses, or tenderness noted. Range of motion normal.Thyroid :large goiter on R Lungs: Normal respiratory effort; chest expands symmetrically. Lungs are clear to auscultation without rales, wheezes, or increased work of breathing. Heart: Normal rate and rhythm. Normal S1 and S2. No gallop, click, or rub.S4 ; no murmur. Abdomen: Bowel sounds normal; abdomen soft and nontender. No masses, organomegaly or hernias noted. Genitalia/ DRE: Varices on the left with minimal weakness on the left. Prostate is mildly enlarged on the right with some induration. No nodules are present                                                                            Musculoskeletal/extremities: No deformity or scoliosis noted of  the thoracic or lumbar spine. No clubbing, cyanosis, edema, or deformity noted. Range of motion  normal .Tone & strength  normal.Joints normal. Nail health  good. Vascular: Carotid, radial artery, dorsalis pedis and  posterior tibial pulses are full and equal. No bruits present. Neurologic: Alert and  oriented x3. Deep tendon reflexes symmetrical and normal.          kin: Intact without suspicious lesions or rashes. Lymph: No cervical, axillary, or inguinal lymphadenopathy present. Psych: Mood and affect are normal. Normally interactive                                                                                          Assessment & Plan:  #1 Medicare Wellness Exam; criteria met ; data entered #2 Problem List reviewed ; Assessment/ Recommendations made  #3  mild prostatic enlargement and asymmetry; PSA in. He has no genitourinary symptoms Plan: see Orders

## 2011-05-11 ENCOUNTER — Other Ambulatory Visit: Payer: Self-pay | Admitting: Internal Medicine

## 2011-05-11 ENCOUNTER — Other Ambulatory Visit: Payer: Medicare Other

## 2011-05-11 DIAGNOSIS — R5381 Other malaise: Secondary | ICD-10-CM

## 2011-05-11 DIAGNOSIS — E785 Hyperlipidemia, unspecified: Secondary | ICD-10-CM

## 2011-05-11 DIAGNOSIS — R6889 Other general symptoms and signs: Secondary | ICD-10-CM

## 2011-05-11 DIAGNOSIS — E049 Nontoxic goiter, unspecified: Secondary | ICD-10-CM

## 2011-05-12 ENCOUNTER — Other Ambulatory Visit (INDEPENDENT_AMBULATORY_CARE_PROVIDER_SITE_OTHER): Payer: Medicare Other

## 2011-05-12 DIAGNOSIS — E785 Hyperlipidemia, unspecified: Secondary | ICD-10-CM

## 2011-05-12 DIAGNOSIS — E049 Nontoxic goiter, unspecified: Secondary | ICD-10-CM

## 2011-05-12 DIAGNOSIS — R5383 Other fatigue: Secondary | ICD-10-CM

## 2011-05-12 DIAGNOSIS — R6889 Other general symptoms and signs: Secondary | ICD-10-CM

## 2011-05-12 DIAGNOSIS — R5381 Other malaise: Secondary | ICD-10-CM

## 2011-05-13 ENCOUNTER — Other Ambulatory Visit: Payer: Medicare Other

## 2011-05-13 LAB — BASIC METABOLIC PANEL
CO2: 28 mEq/L (ref 19–32)
Calcium: 9.9 mg/dL (ref 8.4–10.5)
Chloride: 103 mEq/L (ref 96–112)
Creatinine, Ser: 1.1 mg/dL (ref 0.4–1.5)
Sodium: 139 mEq/L (ref 135–145)

## 2011-05-13 LAB — CBC WITH DIFFERENTIAL/PLATELET
Basophils Absolute: 0 10*3/uL (ref 0.0–0.1)
Eosinophils Relative: 0.6 % (ref 0.0–5.0)
HCT: 45.8 % (ref 39.0–52.0)
Lymphs Abs: 2.7 10*3/uL (ref 0.7–4.0)
MCV: 89.7 fl (ref 78.0–100.0)
Monocytes Absolute: 0.7 10*3/uL (ref 0.1–1.0)
Monocytes Relative: 12.1 % — ABNORMAL HIGH (ref 3.0–12.0)
Neutrophils Relative %: 42.5 % — ABNORMAL LOW (ref 43.0–77.0)
Platelets: 188 10*3/uL (ref 150.0–400.0)
RDW: 12.9 % (ref 11.5–14.6)
WBC: 6 10*3/uL (ref 4.5–10.5)

## 2011-05-13 LAB — HEPATIC FUNCTION PANEL
AST: 29 U/L (ref 0–37)
Albumin: 4.7 g/dL (ref 3.5–5.2)
Alkaline Phosphatase: 59 U/L (ref 39–117)
Total Protein: 7.4 g/dL (ref 6.0–8.3)

## 2011-05-13 LAB — TSH: TSH: 2.21 u[IU]/mL (ref 0.35–5.50)

## 2011-05-13 LAB — LIPID PANEL
Cholesterol: 187 mg/dL (ref 0–200)
HDL: 48.6 mg/dL (ref >39.00)
LDL Cholesterol: 108 mg/dL — ABNORMAL HIGH (ref 0–99)
Total CHOL/HDL Ratio: 4
Triglycerides: 150 mg/dL — ABNORMAL HIGH (ref 0.0–149.0)

## 2011-05-13 LAB — T4, FREE: Free T4: 0.72 ng/dL (ref 0.60–1.60)

## 2011-05-15 ENCOUNTER — Ambulatory Visit
Admission: RE | Admit: 2011-05-15 | Discharge: 2011-05-15 | Disposition: A | Discharge status: Home / Self Care | Payer: Medicare Other | Source: Ambulatory Visit | Attending: Internal Medicine | Admitting: Internal Medicine

## 2011-05-15 ENCOUNTER — Other Ambulatory Visit: Payer: Medicare Other

## 2011-05-15 DIAGNOSIS — E049 Nontoxic goiter, unspecified: Secondary | ICD-10-CM

## 2012-07-07 ENCOUNTER — Other Ambulatory Visit: Payer: Self-pay | Admitting: Family Medicine

## 2012-07-07 DIAGNOSIS — E049 Nontoxic goiter, unspecified: Secondary | ICD-10-CM

## 2012-07-11 ENCOUNTER — Ambulatory Visit
Admission: RE | Admit: 2012-07-11 | Discharge: 2012-07-11 | Disposition: A | Discharge status: Home / Self Care | Payer: Medicare Other | Source: Ambulatory Visit | Attending: Family Medicine | Admitting: Family Medicine

## 2012-07-11 DIAGNOSIS — E049 Nontoxic goiter, unspecified: Secondary | ICD-10-CM

## 2013-04-14 ENCOUNTER — Ambulatory Visit
Admission: RE | Admit: 2013-04-14 | Discharge: 2013-04-14 | Disposition: A | Discharge status: Home / Self Care | Payer: Medicare Other | Source: Ambulatory Visit | Attending: Physician Assistant | Admitting: Physician Assistant

## 2013-04-14 ENCOUNTER — Other Ambulatory Visit: Payer: Self-pay | Admitting: Physician Assistant

## 2013-04-14 DIAGNOSIS — D72829 Elevated white blood cell count, unspecified: Secondary | ICD-10-CM

## 2013-04-14 DIAGNOSIS — R52 Pain, unspecified: Secondary | ICD-10-CM

## 2013-04-14 DIAGNOSIS — R109 Unspecified abdominal pain: Secondary | ICD-10-CM

## 2013-04-14 MED ORDER — IOHEXOL 300 MG/ML  SOLN
100.0000 mL | Freq: Once | INTRAMUSCULAR | Status: AC | PRN
  Administered 2013-04-14: 100 mL via INTRAVENOUS

## 2013-04-14 MED ORDER — IOHEXOL 300 MG/ML  SOLN
30.0000 mL | Freq: Once | INTRAMUSCULAR | Status: AC | PRN
  Administered 2013-04-14: 30 mL via ORAL

## 2013-08-08 ENCOUNTER — Other Ambulatory Visit: Payer: Self-pay | Admitting: Dermatology

## 2014-02-24 ENCOUNTER — Ambulatory Visit (INDEPENDENT_AMBULATORY_CARE_PROVIDER_SITE_OTHER): Payer: Medicare Other

## 2014-02-24 ENCOUNTER — Ambulatory Visit (INDEPENDENT_AMBULATORY_CARE_PROVIDER_SITE_OTHER): Payer: Medicare Other | Admitting: Family Medicine

## 2014-02-24 VITALS — BP 135/78 | HR 60 | Temp 97.6°F | Resp 16 | Ht 69.5 in | Wt 181.5 lb

## 2014-02-24 DIAGNOSIS — J209 Acute bronchitis, unspecified: Secondary | ICD-10-CM

## 2014-02-24 DIAGNOSIS — J329 Chronic sinusitis, unspecified: Secondary | ICD-10-CM

## 2014-02-24 DIAGNOSIS — R509 Fever, unspecified: Secondary | ICD-10-CM

## 2014-02-24 LAB — POCT CBC
Granulocyte percent: 86.6 %G — AB (ref 37–80)
HEMATOCRIT: 48.1 % (ref 43.5–53.7)
HEMOGLOBIN: 15.3 g/dL (ref 14.1–18.1)
LYMPH, POC: 1.4 (ref 0.6–3.4)
MCH, POC: 28.8 pg (ref 27–31.2)
MCHC: 31.8 g/dL (ref 31.8–35.4)
MCV: 90.8 fL (ref 80–97)
MID (cbc): 0.5 (ref 0–0.9)
MPV: 7.4 fL (ref 0–99.8)
POC Granulocyte: 12.2 — AB (ref 2–6.9)
POC LYMPH PERCENT: 9.9 %L — AB (ref 10–50)
POC MID %: 3.5 %M (ref 0–12)
Platelet Count, POC: 236 10*3/uL (ref 142–424)
RBC: 5.3 M/uL (ref 4.69–6.13)
RDW, POC: 13.4 %
WBC: 14.1 10*3/uL — AB (ref 4.6–10.2)

## 2014-02-24 LAB — POCT INFLUENZA A/B
Influenza A, POC: NEGATIVE
Influenza B, POC: NEGATIVE

## 2014-02-24 MED ORDER — MOXIFLOXACIN HCL 400 MG PO TABS: 400.0000 mg | ORAL_TABLET | Freq: Every day | ORAL | Status: DC

## 2014-02-24 NOTE — Patient Instructions (Addendum)
Drink plenty of fluids and get enough rest  Discontinue the azithromycin  Begin the Avelox 1 daily for 7 days  Continue the cough syrup that you have.  If symptoms get worse or if you're not improving please return.

## 2014-02-24 NOTE — Progress Notes (Addendum)
Subjective: 58 year old man who has been ill all week. Monday he got sick with a fever up to 102. He felt congested and achy. He is been having had congestion and cough. Coughs up some stuff periodically. He works in [employer], [employer]. He had been had a dancing event a week ago. They dance regularly, but he's been unable to this week. He has felt lousy all week. On Tuesday [facility] and was treated symptomatically with Tessalon and cough syrup. On Thursday did not seem any better and he went back in, was reassessed but no x-rays or labs, and was treated with azithromycin and prednisone. This is his third day on them, and he continues to feel lousy.  Objective: TMs are normal. Throat clear. Neck supple without significant nodes. Flu swab was taken. Chest is clear to auscultation except on forced expiration there is end expiratory spasming. Heart regular without murmurs. He looks ill, like he feels bad.  Assessment: Persistent cough and malaise and postnasal congestion  Plan: Chest x-ray CBC Flu swab  Results for orders placed or performed in visit on 02/24/14  POCT CBC  Result Value Ref Range   WBC 14.1 (A) 4.6 - 10.2 K/uL   Lymph, poc 1.4 0.6 - 3.4   POC LYMPH PERCENT 9.9 (A) 10 - 50 %L   MID (cbc) 0.5 0 - 0.9   POC MID % 3.5 0 - 12 %M   POC Granulocyte 12.2 (A) 2 - 6.9   Granulocyte percent 86.6 (A) 37 - 80 %G   RBC 5.30 4.69 - 6.13 M/uL   Hemoglobin 15.3 14.1 - 18.1 g/dL   HCT, POC 40.948.1 81.143.5 - 53.7 %   MCV 90.8 80 - 97 fL   MCH, POC 28.8 27 - 31.2 pg   MCHC 31.8 31.8 - 35.4 g/dL   RDW, POC 91.413.4 %   Platelet Count, POC 236 142 - 424 K/uL   MPV 7.4 0 - 99.8 fL  POCT Influenza A/B  Result Value Ref Range   Influenza A, POC Negative    Influenza B, POC Negative     [facility] reading (PRIMARY) by  Dr. Alwyn RenHopper Normal chest x-ray  Will switch the patient over to Avelox. No history of heart disease.Therefore should be fine even though azithromycin still in  system.   .Marland Kitchen

## 2014-07-19 ENCOUNTER — Other Ambulatory Visit: Payer: Self-pay | Admitting: Family Medicine

## 2014-07-19 DIAGNOSIS — E049 Nontoxic goiter, unspecified: Secondary | ICD-10-CM

## 2014-07-23 ENCOUNTER — Ambulatory Visit
Admission: RE | Admit: 2014-07-23 | Discharge: 2014-07-23 | Disposition: A | Discharge status: Home / Self Care | Payer: Medicare Other | Source: Ambulatory Visit | Attending: Family Medicine | Admitting: Family Medicine

## 2014-07-23 DIAGNOSIS — E049 Nontoxic goiter, unspecified: Secondary | ICD-10-CM

## 2014-09-04 ENCOUNTER — Other Ambulatory Visit: Payer: Self-pay | Admitting: Endocrinology

## 2014-09-04 DIAGNOSIS — E049 Nontoxic goiter, unspecified: Secondary | ICD-10-CM

## 2015-02-25 ENCOUNTER — Ambulatory Visit (INDEPENDENT_AMBULATORY_CARE_PROVIDER_SITE_OTHER): Payer: Medicare Other | Admitting: Internal Medicine

## 2015-02-25 ENCOUNTER — Ambulatory Visit (INDEPENDENT_AMBULATORY_CARE_PROVIDER_SITE_OTHER): Payer: Medicare Other

## 2015-02-25 VITALS — BP 120/70 | HR 75 | Temp 97.8°F | Resp 17 | Ht 69.5 in | Wt 181.0 lb

## 2015-02-25 DIAGNOSIS — R221 Localized swelling, mass and lump, neck: Secondary | ICD-10-CM | POA: Diagnosis not present

## 2015-02-25 DIAGNOSIS — R5383 Other fatigue: Secondary | ICD-10-CM

## 2015-02-25 DIAGNOSIS — R05 Cough: Secondary | ICD-10-CM | POA: Diagnosis not present

## 2015-02-25 DIAGNOSIS — R0981 Nasal congestion: Secondary | ICD-10-CM | POA: Diagnosis not present

## 2015-02-25 DIAGNOSIS — R059 Cough, unspecified: Secondary | ICD-10-CM

## 2015-02-25 DIAGNOSIS — R112 Nausea with vomiting, unspecified: Secondary | ICD-10-CM | POA: Diagnosis not present

## 2015-02-25 LAB — COMPREHENSIVE METABOLIC PANEL
ALBUMIN: 4.5 g/dL (ref 3.6–5.1)
ALT: 44 U/L (ref 9–46)
AST: 27 U/L (ref 10–35)
Alkaline Phosphatase: 77 U/L (ref 40–115)
BILIRUBIN TOTAL: 0.4 mg/dL (ref 0.2–1.2)
BUN: 13 mg/dL (ref 7–25)
CALCIUM: 9.4 mg/dL (ref 8.6–10.3)
CO2: 25 mmol/L (ref 20–31)
Chloride: 102 mmol/L (ref 98–110)
Creat: 0.99 mg/dL (ref 0.70–1.25)
Glucose, Bld: 86 mg/dL (ref 65–99)
Potassium: 4.6 mmol/L (ref 3.5–5.3)
Sodium: 138 mmol/L (ref 135–146)
Total Protein: 7 g/dL (ref 6.1–8.1)

## 2015-02-25 LAB — TSH: TSH: 2.209 u[IU]/mL (ref 0.350–4.500)

## 2015-02-25 LAB — POCT SEDIMENTATION RATE: POCT SED RATE: 5 mm/hr (ref 0–22)

## 2015-02-25 LAB — POCT CBC
GRANULOCYTE PERCENT: 60.6 % (ref 37–80)
HEMATOCRIT: 46 % (ref 43.5–53.7)
Hemoglobin: 15.5 g/dL (ref 14.1–18.1)
Lymph, poc: 2.4 (ref 0.6–3.4)
MCH, POC: 29.2 pg (ref 27–31.2)
MCHC: 33.7 g/dL (ref 31.8–35.4)
MCV: 86.7 fL (ref 80–97)
MID (CBC): 0.7 (ref 0–0.9)
MPV: 7.2 fL (ref 0–99.8)
POC Granulocyte: 4.7 (ref 2–6.9)
POC LYMPH PERCENT: 30.8 %L (ref 10–50)
POC MID %: 8.6 % (ref 0–12)
Platelet Count, POC: 229 10*3/uL (ref 142–424)
RBC: 5.31 M/uL (ref 4.69–6.13)
RDW, POC: 13.2 %
WBC: 7.7 10*3/uL (ref 4.6–10.2)

## 2015-02-25 MED ORDER — HYDROCODONE-HOMATROPINE 5-1.5 MG/5ML PO SYRP: 5.0000 mL | ORAL_SOLUTION | Freq: Four times a day (QID) | ORAL | Status: DC | PRN

## 2015-02-25 MED ORDER — ONDANSETRON HCL 4 MG PO TABS: 4.0000 mg | ORAL_TABLET | Freq: Three times a day (TID) | ORAL | Status: DC | PRN

## 2015-02-25 MED ORDER — LEVOFLOXACIN 500 MG PO TABS: 500.0000 mg | ORAL_TABLET | Freq: Every day | ORAL | Status: DC

## 2015-02-25 NOTE — Progress Notes (Signed)
Subjective:  This chart was scribed for Ellamae Sia, MD by Andrew Au, ED Scribe. This patient was seen in room 1 and the patient's care was started at 4:20 PM.   Patient ID: Patrick Fuller., male    DOB: 14-Sep-1955, 59 y.o.   MRN: [id number]  HPI   Chief Complaint  Patient presents with  . Cough  . Nasal Congestion   HPI Comments: Patrick Stoneking. is a 59 y.o. male who presents to the [facility] complaining of cough and nasal congestion. Pt states symptoms started after returning from [place] 2 [place] ago. He was put on zpak at another facility but states symptoms worsened after starting medication with dizziness and nausea and was prescribed zofran. He took Zpak the following day but states nausea returned. Vomiting at times.  Zpak was discontinued.  He presents today with a dry hacking cough, rhinorrhea, fatigue and some nausea. Cough worsens with exertion and has been keeping him awake at night. Too fatigued and sleep deprived to work.   Patient Active Problem List   Diagnosis Date Noted  . HYPERTHYROIDISM 03/23/2008  . HYPERLIPIDEMIA 03/23/2008  . BIPOLAR DISORDER UNSPECIFIED 03/23/2008  . ERECTILE DYSFUNCTION 03/23/2008  . 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 Penicillins     REACTION: hives   Prior to Admission medications   Medication Sig Start Date End Date Taking? Authorizing Provider  ACAI PO Take by mouth. 2 BY MOUTH ONCE DAILY   Yes Historical Provider, MD  aspirin 81 MG tablet Take 160 mg by mouth daily.   Yes Historical Provider, MD  B Complex-C (SUPER B COMPLEX PO) Take by mouth daily.   Yes Historical Provider, MD  buPROPion (WELLBUTRIN) 100 MG tablet Take 100 mg by mouth 3 (three) times daily.   Yes Historical Provider, MD  Calcium Citrate-Vitamin D (CITRACAL/VITAMIN D PO) Take by mouth daily.   Yes Historical Provider, MD  Coenzyme Q10 (COQ-10) 100 MG CAPS Take by mouth. 2 by mouth daily   Yes Historical  Provider, MD  Glucosamine-Chondroit-Vit C-Mn (GLUCOSAMINE 1500 COMPLEX PO) Take by mouth. 2 by mouth daily   Yes Historical Provider, MD  lithium carbonate 300 MG capsule Take 300 mg by mouth 2 (two) times daily with a meal.   Yes Historical Provider, MD  Omega-3 Fatty Acids (FISH OIL) 1200 MG CAPS Take by mouth. 2 by mouth daily   Yes Historical Provider, MD  Saw Palmetto, Serenoa repens, 450 MG CAPS Take by mouth. 2 by mouth daily   Yes Historical Provider, MD  vitamin C (ASCORBIC ACID) 500 MG tablet Take 500 mg by mouth daily.   Yes Historical Provider, MD  Vitamin D-Vitamin K (D3 + K2 DOTS PO) Take by mouth daily.   Yes Historical Provider, MD   Review of Systems  Constitutional: Positive for fatigue.  HENT: Positive for congestion, rhinorrhea and sore throat.   Gastrointestinal: Positive for nausea.   Objective:   Physical Exam  Constitutional: He is oriented to person, place, and time. He appears well-developed and well-nourished. No distress.  HENT:  Head: Normocephalic and atraumatic.  Mouth/Throat: Oropharynx is clear and moist. No oropharyngeal exudate.  Nose is congested  Eyes: Conjunctivae and EOM are normal.  Neck: Neck supple.  large right sided neck mass thought to be a goiter  Cardiovascular: Normal rate.   Pulmonary/Chest: Effort normal and breath sounds normal. He has no wheezes. He has no rales.  Musculoskeletal:  Normal range of motion.  Neurological: He is alert and oriented to person, place, and time.  Skin: Skin is warm and dry.  Psychiatric: He has a normal mood and affect. His behavior is normal.  Nursing note and vitals reviewed.   Filed Vitals:   02/25/15 1513  BP: 120/70  Pulse: 75  Temp: 97.8 F (36.6 C)  TempSrc: Oral  Resp: 17  Height: 5' 9.5" (1.765 m)  Weight: 181 lb (82.101 kg)  SpO2: 97%   Results for orders placed or performed in visit on 02/25/15  POCT CBC  Result Value Ref Range   WBC 7.7 4.6 - 10.2 K/uL   Lymph, poc 2.4 0.6 - 3.4    POC LYMPH PERCENT 30.8 10 - 50 %L   MID (cbc) 0.7 0 - 0.9   POC MID % 8.6 0 - 12 %M   POC Granulocyte 4.7 2 - 6.9   Granulocyte percent 60.6 37 - 80 %G   RBC 5.31 4.69 - 6.13 M/uL   Hemoglobin 15.5 14.1 - 18.1 g/dL   HCT, POC 46.946.0 62.943.5 - 53.7 %   MCV 86.7 80 - 97 fL   MCH, POC 29.2 27 - 31.2 pg   MCHC 33.7 31.8 - 35.4 g/dL   RDW, POC 52.813.2 %   Platelet Count, POC 229 142 - 424 K/uL   MPV 7.2 0 - 99.8 fL   [facility] reading (PRIMARY) by Dr. Merla Richesoolittle. CXR- left lower lobe infiltrate. Right upper mediastinal mass is thought to be a goiter by ultrasound.  But rad said=he lungs are well-expanded and clear. The heart and pulmonary vascularity are normal. There is stable deviation of the trachea toward the left due to a known enlarged right thyroid lobe. There is no pleural effusion or pneumothorax. The bony thorax is unremarkable  Assessment & Plan:   1. Cough --?:LLL pneu-atypical  2. Neck mass   3. Other fatigue   4. Non-intractable vomiting with nausea, unspecified vomiting type -?2 to zith plus codiene  5. Nasal congestion     Orders Placed This Encounter  Procedures  . Comprehensive metabolic panel  . TSH  . POCT CBC  . POCT SEDIMENTATION RATE    Meds ordered this encounter  Medications  . levofloxacin (LEVAQUIN) 500 MG tablet    Sig: Take 1 tablet (500 mg total) by mouth daily.    Dispense:  7 tablet    Refill:  0  . HYDROcodone-homatropine (HYCODAN) 5-1.5 MG/5ML syrup    Sig: Take 5 mLs by mouth every 6 (six) hours as needed.    Dispense:  120 mL    Refill:  0  . ondansetron (ZOFRAN) 4 MG tablet    Sig: Take 1 tablet (4 mg total) by mouth every 8 (eight) hours as needed for nausea or vomiting.    Dispense:  20 tablet    Refill:  0    By signing my name below, I, Raven Small, attest that this documentation has been prepared under the direction and in the presence of Ellamae Siaobert Aviannah Castoro, MD.  Electronically Signed: Andrew Auaven Small, ED Scribe. 02/25/2015. 4:34 PM.   I  have completed the patient encounter in its entirety as documented by the scribe, with editing by me where necessary. Genessis Flanary P. Merla Richesoolittle, M.D.   Add 11/15 Results for orders placed or performed in visit on 02/25/15  Comprehensive metabolic panel  Result Value Ref Range   Sodium 138 135 - 146 mmol/L   Potassium 4.6 3.5 - 5.3 mmol/L  Chloride 102 98 - 110 mmol/L   CO2 25 20 - 31 mmol/L   Glucose, Bld 86 65 - 99 mg/dL   BUN 13 7 - 25 mg/dL   Creat 0.98 1.19 - 1.47 mg/dL   Total Bilirubin 0.4 0.2 - 1.2 mg/dL   Alkaline Phosphatase 77 40 - 115 U/L   AST 27 10 - 35 U/L   ALT 44 9 - 46 U/L   Total Protein 7.0 6.1 - 8.1 g/dL   Albumin 4.5 3.6 - 5.1 g/dL   Calcium 9.4 8.6 - 82.9 mg/dL  TSH  Result Value Ref Range   TSH 2.209 0.350 - 4.500 uIU/mL  POCT CBC  Result Value Ref Range   WBC 7.7 4.6 - 10.2 K/uL   Lymph, poc 2.4 0.6 - 3.4   POC LYMPH PERCENT 30.8 10 - 50 %L   MID (cbc) 0.7 0 - 0.9   POC MID % 8.6 0 - 12 %M   POC Granulocyte 4.7 2 - 6.9   Granulocyte percent 60.6 37 - 80 %G   RBC 5.31 4.69 - 6.13 M/uL   Hemoglobin 15.5 14.1 - 18.1 g/dL   HCT, POC 56.2 13.0 - 53.7 %   MCV 86.7 80 - 97 fL   MCH, POC 29.2 27 - 31.2 pg   MCHC 33.7 31.8 - 35.4 g/dL   RDW, POC 86.5 %   Platelet Count, POC 229 142 - 424 K/uL   MPV 7.2 0 - 99.8 fL  POCT SEDIMENTATION RATE  Result Value Ref Range   POCT SED RATE 5 0 - 22 mm/hr   Will f/u results of treatment 7-10d

## 2015-02-27 ENCOUNTER — Other Ambulatory Visit: Payer: Medicare Other

## 2016-07-14 ENCOUNTER — Other Ambulatory Visit: Payer: Self-pay | Admitting: Endocrinology

## 2016-07-14 DIAGNOSIS — E890 Postprocedural hypothyroidism: Secondary | ICD-10-CM

## 2016-11-11 ENCOUNTER — Other Ambulatory Visit: Payer: Medicare Other

## 2016-11-18 ENCOUNTER — Ambulatory Visit
Admission: RE | Admit: 2016-11-18 | Discharge: 2016-11-18 | Disposition: A | Discharge status: Home / Self Care | Payer: Medicare Other | Source: Ambulatory Visit | Attending: Endocrinology | Admitting: Endocrinology

## 2016-11-18 DIAGNOSIS — E890 Postprocedural hypothyroidism: Secondary | ICD-10-CM

## 2016-12-01 ENCOUNTER — Other Ambulatory Visit: Payer: Self-pay | Admitting: Endocrinology

## 2016-12-01 DIAGNOSIS — E049 Nontoxic goiter, unspecified: Secondary | ICD-10-CM

## 2017-01-12 ENCOUNTER — Ambulatory Visit
Admission: RE | Admit: 2017-01-12 | Discharge: 2017-01-12 | Disposition: A | Discharge status: Home / Self Care | Payer: Medicare Other | Source: Ambulatory Visit | Attending: Endocrinology | Admitting: Endocrinology

## 2017-01-12 ENCOUNTER — Other Ambulatory Visit (HOSPITAL_COMMUNITY)
Admission: RE | Admit: 2017-01-12 | Discharge: 2017-01-12 | Disposition: A | Discharge status: Home / Self Care | Payer: Medicare Other | Source: Ambulatory Visit | Attending: General Surgery | Admitting: General Surgery

## 2017-01-12 DIAGNOSIS — E049 Nontoxic goiter, unspecified: Secondary | ICD-10-CM | POA: Diagnosis present

## 2017-01-26 ENCOUNTER — Encounter (HOSPITAL_COMMUNITY): Payer: Self-pay

## 2018-01-26 ENCOUNTER — Other Ambulatory Visit: Payer: Self-pay | Admitting: Otolaryngology

## 2018-01-26 DIAGNOSIS — E049 Nontoxic goiter, unspecified: Secondary | ICD-10-CM

## 2018-01-26 DIAGNOSIS — R1310 Dysphagia, unspecified: Secondary | ICD-10-CM

## 2018-01-26 DIAGNOSIS — R1319 Other dysphagia: Secondary | ICD-10-CM

## 2018-02-01 ENCOUNTER — Ambulatory Visit
Admission: RE | Admit: 2018-02-01 | Discharge: 2018-02-01 | Disposition: A | Discharge status: Home / Self Care | Payer: Medicare Other | Source: Ambulatory Visit | Attending: Otolaryngology | Admitting: Otolaryngology

## 2018-02-01 DIAGNOSIS — E049 Nontoxic goiter, unspecified: Secondary | ICD-10-CM

## 2018-02-01 DIAGNOSIS — R1310 Dysphagia, unspecified: Secondary | ICD-10-CM

## 2018-02-01 DIAGNOSIS — R1319 Other dysphagia: Secondary | ICD-10-CM

## 2018-02-01 MED ORDER — IOPAMIDOL (ISOVUE-300) INJECTION 61%
75.0000 mL | Freq: Once | INTRAVENOUS | Status: AC | PRN
  Administered 2018-02-01: 75 mL via INTRAVENOUS

## 2018-02-14 NOTE — H&P (Signed)
  Patrick Webster. is an 62 y.o. male.   Chief Complaint: breathing difficulty HPI: History of respiratory obstruction, found to have large, obstructing goiter.  Past Medical History:  Diagnosis Date  . Depression   . Hyperlipidemia   . Hyperthyroidism    S/P ablation    Past Surgical History:  Procedure Laterality Date  . EYE SURGERY    . RETINAL DETACHMENT SURGERY     OD  . TONSILLECTOMY AND ADENOIDECTOMY      Family History  Problem Relation Age of Onset  . Cancer Maternal Uncle        1 with colon & 1 hepatic  . Cancer Maternal Grandfather        colon  . Aortic aneurysm Father        also thoracic   . Deep vein thrombosis Father        thigh X 2  . Aneurysm Paternal Uncle        cns  . Deep vein thrombosis Paternal Uncle        calf   Social History:  reports that he has never smoked. He has never used smokeless tobacco. He reports that he does not drink alcohol or use drugs.  Allergies:  Allergies  Allergen Reactions  . Penicillins     REACTION: hives    No medications prior to admission.    No results found for this or any previous visit (from the past 48 hour(s)). No results found.  ROS: otherwise negative  There were no vitals taken for this visit.  PHYSICAL EXAM: Overall appearance:  Healthy appearing, in no distress, mild inspiratory stridor. Head:  Normocephalic, atraumatic. Ears: External auditory canals are clear; tympanic membranes are intact and the middle ears are free of any effusion. Nose: External nose is healthy in appearance. Internal nasal exam free of any lesions or obstruction. Oral Cavity/pharynx:  There are no mucosal lesions or masses identified. Hypopharynx/Larynx: no signs of any mucosal lesions or masses identified. Vocal cords move normally. Neuro:  No identifiable neurologic deficits. Neck: Large multinodular goiter on the right side..  Studies Reviewed: CT neck    Assessment/Plan Proceed with  thyroidectomy.  Serena Colonel 02/14/2018, 12:51 PM

## 2018-02-15 ENCOUNTER — Encounter (HOSPITAL_COMMUNITY): Payer: Self-pay

## 2018-02-15 NOTE — Pre-Procedure Instructions (Signed)
Marvell Fuller.  02/15/2018      [id number] [address] [place] [address] Phone: [phone] Fax: [phone]    Your procedure is scheduled on Nov. 18th.  Report to [facility] Admitting at 9:00 A.M.  Call this number if you have problems the morning of surgery:  [phone]   Remember:  Do not eat or drink after midnight.     Take these medicines the morning of surgery with A SIP OF WATER    Wellbutrin  Levofloxacin (levaquin)  Zofran  7 days prior to surgery STOP taking any Aspirin (unless otherwise instructed by your surgeon), Aleve, Naproxen, Ibuprofen, Motrin, Advil, Goody's, BC's, all herbal medications, fish oil, and all vitamins   Do not wear jewelry  Do not wear lotions, powders, or perfumes, or deodorant.  Do not shave 48 hours prior to surgery.  Men may shave face and neck.  Do not bring valuables to the hospital.  [facility] is not responsible for any belongings or valuables.   [facility]- Preparing For Surgery  Before surgery, you can play an important role. Because skin is not sterile, your skin needs to be as free of germs as possible. You can reduce the number of germs on your skin by washing with CHG (chlorahexidine gluconate) Soap before surgery.  CHG is an antiseptic cleaner which kills germs and bonds with the skin to continue killing germs even after washing.    Oral Hygiene is also important to reduce your risk of infection.  Remember - BRUSH YOUR TEETH THE MORNING OF SURGERY WITH YOUR REGULAR TOOTHPASTE  Please do not use if you have an allergy to CHG or antibacterial soaps. If your skin becomes reddened/irritated stop using the CHG.  Do not shave (including legs and underarms) for at least 48 hours prior to first CHG shower. It is OK to shave your face.  Please follow these instructions carefully.   1. Shower the NIGHT BEFORE SURGERY and the MORNING OF SURGERY with CHG.   2. If  you chose to wash your hair, wash your hair first as usual with your normal shampoo.  3. After you shampoo, rinse your hair and body thoroughly to remove the shampoo.  4. Use CHG as you would any other liquid soap. You can apply CHG directly to the skin and wash gently with a scrungie or a clean washcloth.   5. Apply the CHG Soap to your body ONLY FROM THE NECK DOWN.  Do not use on open wounds or open sores. Avoid contact with your eyes, ears, mouth and genitals (private parts). Wash Face and genitals (private parts)  with your normal soap.  6. Wash thoroughly, paying special attention to the area where your surgery will be performed.  7. Thoroughly rinse your body with warm water from the neck down.  8. DO NOT shower/wash with your normal soap after using and rinsing off the CHG Soap.  9. Pat yourself dry with a CLEAN TOWEL.  10. Wear CLEAN PAJAMAS to bed the night before surgery, wear comfortable clothes the morning of surgery  11. Place CLEAN SHEETS on your bed the night of your first shower and DO NOT SLEEP WITH PETS.    Day of Surgery:  Do not apply any deodorants/lotions.  Please wear clean clothes to the hospital/surgery center.   Remember to brush your teeth WITH YOUR REGULAR TOOTHPASTE.   Contacts, dentures or bridgework may not  be worn into surgery.  Leave your suitcase in the car.  After surgery it may be brought to your room.  For patients admitted to the hospital, discharge time will be determined by your treatment team.  Patients discharged the day of surgery will not be allowed to drive home.   Please read over the following fact sheets that you were given. Coughing and Deep Breathing, MRSA Information and Surgical Site Infection Prevention

## 2018-02-16 ENCOUNTER — Other Ambulatory Visit: Payer: Self-pay

## 2018-02-16 ENCOUNTER — Encounter (HOSPITAL_COMMUNITY)
Admission: RE | Admit: 2018-02-16 | Discharge: 2018-02-16 | Disposition: A | Discharge status: Home / Self Care | Payer: Medicare Other | Source: Ambulatory Visit | Attending: Otolaryngology | Admitting: Otolaryngology

## 2018-02-16 ENCOUNTER — Encounter (HOSPITAL_COMMUNITY)
Admission: RE | Admit: 2018-02-16 | Discharge: 2018-02-16 | Disposition: A | Discharge status: Home / Self Care | Payer: Medicare Other | Source: Ambulatory Visit | Attending: Anesthesiology | Admitting: Anesthesiology

## 2018-02-16 ENCOUNTER — Encounter (HOSPITAL_COMMUNITY): Payer: Self-pay

## 2018-02-16 DIAGNOSIS — E049 Nontoxic goiter, unspecified: Secondary | ICD-10-CM | POA: Insufficient documentation

## 2018-02-16 HISTORY — DX: Other specified postprocedural states: Z98.890

## 2018-02-16 HISTORY — DX: Adverse effect of unspecified anesthetic, initial encounter: T41.45XA

## 2018-02-16 HISTORY — DX: Pneumonia, unspecified organism: J18.9

## 2018-02-16 HISTORY — DX: Family history of other specified conditions: Z84.89

## 2018-02-16 HISTORY — DX: Other complications of anesthesia, initial encounter: T88.59XA

## 2018-02-16 HISTORY — DX: Nausea with vomiting, unspecified: R11.2

## 2018-02-16 LAB — CBC
HEMATOCRIT: 50.2 % (ref 39.0–52.0)
HEMOGLOBIN: 15.3 g/dL (ref 13.0–17.0)
MCH: 27.6 pg (ref 26.0–34.0)
MCHC: 30.5 g/dL (ref 30.0–36.0)
MCV: 90.6 fL (ref 80.0–100.0)
Platelets: 256 10*3/uL (ref 150–400)
RBC: 5.54 MIL/uL (ref 4.22–5.81)
RDW: 12.8 % (ref 11.5–15.5)
WBC: 7.3 10*3/uL (ref 4.0–10.5)
nRBC: 0 % (ref 0.0–0.2)

## 2018-02-16 NOTE — Progress Notes (Signed)
PCP - Gildardo Cranker MD   Chest x-ray - 02/16/18 EKG - N/A  Blood Thinner Instructions: N/A  Aspirin Instructions: stopped  Anesthesia review: none  Patient denies shortness of breath, fever, cough and chest pain at PAT appointment   Patient verbalized understanding of instructions that were given to them at the PAT appointment. Patient was also instructed that they will need to review over the PAT instructions again at home before surgery.

## 2018-02-28 ENCOUNTER — Observation Stay (HOSPITAL_COMMUNITY)
Admission: RE | Admit: 2018-02-28 | Discharge: 2018-03-01 | Disposition: A | Discharge status: Home / Self Care | Payer: Medicare Other | Source: Ambulatory Visit | Attending: Otolaryngology | Admitting: Otolaryngology

## 2018-02-28 ENCOUNTER — Ambulatory Visit (HOSPITAL_COMMUNITY): Payer: Medicare Other | Admitting: Certified Registered Nurse Anesthetist

## 2018-02-28 ENCOUNTER — Encounter (HOSPITAL_COMMUNITY): Payer: Self-pay

## 2018-02-28 ENCOUNTER — Encounter (HOSPITAL_COMMUNITY)
Admission: RE | Disposition: A | Discharge status: Home / Self Care | Payer: Self-pay | Source: Ambulatory Visit | Attending: Otolaryngology

## 2018-02-28 ENCOUNTER — Other Ambulatory Visit: Payer: Self-pay

## 2018-02-28 DIAGNOSIS — Z791 Long term (current) use of non-steroidal anti-inflammatories (NSAID): Secondary | ICD-10-CM | POA: Insufficient documentation

## 2018-02-28 DIAGNOSIS — Z9889 Other specified postprocedural states: Secondary | ICD-10-CM

## 2018-02-28 DIAGNOSIS — Z79899 Other long term (current) drug therapy: Secondary | ICD-10-CM | POA: Diagnosis not present

## 2018-02-28 DIAGNOSIS — E785 Hyperlipidemia, unspecified: Secondary | ICD-10-CM | POA: Insufficient documentation

## 2018-02-28 DIAGNOSIS — E0590 Thyrotoxicosis, unspecified without thyrotoxic crisis or storm: Secondary | ICD-10-CM | POA: Diagnosis not present

## 2018-02-28 DIAGNOSIS — F329 Major depressive disorder, single episode, unspecified: Secondary | ICD-10-CM | POA: Insufficient documentation

## 2018-02-28 DIAGNOSIS — E890 Postprocedural hypothyroidism: Secondary | ICD-10-CM

## 2018-02-28 DIAGNOSIS — Z7982 Long term (current) use of aspirin: Secondary | ICD-10-CM | POA: Insufficient documentation

## 2018-02-28 DIAGNOSIS — Z880 Allergy status to penicillin: Secondary | ICD-10-CM | POA: Insufficient documentation

## 2018-02-28 DIAGNOSIS — E049 Nontoxic goiter, unspecified: Secondary | ICD-10-CM | POA: Diagnosis present

## 2018-02-28 HISTORY — PX: THYROIDECTOMY: SHX17

## 2018-02-28 SURGERY — THYROIDECTOMY
Anesthesia: General | Site: Neck | Laterality: Right

## 2018-02-28 MED ORDER — BUPROPION HCL ER (SR) 150 MG PO TB12
400.0000 mg | ORAL_TABLET | Freq: Every day | ORAL | Status: DC
  Administered 2018-03-01: 400 mg via ORAL
  Filled 2018-02-28 (×2): qty 1

## 2018-02-28 MED ORDER — SCOPOLAMINE 1 MG/3DAYS TD PT72
MEDICATED_PATCH | TRANSDERMAL | Status: DC | PRN
  Administered 2018-02-28: 1 via TRANSDERMAL

## 2018-02-28 MED ORDER — PROPOFOL 1000 MG/100ML IV EMUL
INTRAVENOUS | Status: AC
  Filled 2018-02-28: qty 300

## 2018-02-28 MED ORDER — LITHIUM CARBONATE 300 MG PO CAPS
600.0000 mg | ORAL_CAPSULE | Freq: Every day | ORAL | Status: DC
  Administered 2018-02-28: 600 mg via ORAL
  Filled 2018-02-28: qty 2

## 2018-02-28 MED ORDER — LIDOCAINE 2% (20 MG/ML) 5 ML SYRINGE
INTRAMUSCULAR | Status: DC | PRN
  Administered 2018-02-28: 40 mg via INTRAVENOUS

## 2018-02-28 MED ORDER — FENTANYL CITRATE (PF) 100 MCG/2ML IJ SOLN
25.0000 ug | INTRAMUSCULAR | Status: DC | PRN
  Administered 2018-02-28 (×3): 50 ug via INTRAVENOUS

## 2018-02-28 MED ORDER — MIDAZOLAM HCL 2 MG/2ML IJ SOLN
INTRAMUSCULAR | Status: AC
  Filled 2018-02-28: qty 2

## 2018-02-28 MED ORDER — MIDAZOLAM HCL 5 MG/5ML IJ SOLN
INTRAMUSCULAR | Status: DC | PRN
  Administered 2018-02-28: 2 mg via INTRAVENOUS

## 2018-02-28 MED ORDER — OXYCODONE HCL 5 MG/5ML PO SOLN: 5.0000 mg | Freq: Once | ORAL | Status: DC | PRN

## 2018-02-28 MED ORDER — DEXAMETHASONE SODIUM PHOSPHATE 4 MG/ML IJ SOLN
INTRAMUSCULAR | Status: DC | PRN
  Administered 2018-02-28: 10 mg via INTRAVENOUS

## 2018-02-28 MED ORDER — COQ-10 100 MG PO CAPS: 2.0000 | ORAL_CAPSULE | Freq: Every day | ORAL | Status: DC

## 2018-02-28 MED ORDER — BUPROPION HCL ER (SR) 150 MG PO TB12: 400.0000 mg | ORAL_TABLET | Freq: Two times a day (BID) | ORAL | Status: DC

## 2018-02-28 MED ORDER — ONDANSETRON HCL 4 MG PO TABS: 4.0000 mg | ORAL_TABLET | Freq: Three times a day (TID) | ORAL | Status: DC | PRN

## 2018-02-28 MED ORDER — FENTANYL CITRATE (PF) 100 MCG/2ML IJ SOLN
INTRAMUSCULAR | Status: AC
  Filled 2018-02-28: qty 2

## 2018-02-28 MED ORDER — LACTATED RINGERS IV SOLN
INTRAVENOUS | Status: DC
  Administered 2018-02-28 (×2): via INTRAVENOUS

## 2018-02-28 MED ORDER — PROPOFOL 10 MG/ML IV BOLUS
INTRAVENOUS | Status: DC | PRN
  Administered 2018-02-28: 130 mg via INTRAVENOUS

## 2018-02-28 MED ORDER — HYDROCODONE-ACETAMINOPHEN 5-325 MG PO TABS
1.0000 | ORAL_TABLET | ORAL | Status: DC | PRN
  Administered 2018-02-28 – 2018-03-01 (×3): 1 via ORAL
  Filled 2018-02-28 (×3): qty 1

## 2018-02-28 MED ORDER — FENTANYL CITRATE (PF) 250 MCG/5ML IJ SOLN
INTRAMUSCULAR | Status: AC
  Filled 2018-02-28: qty 5

## 2018-02-28 MED ORDER — PHENYLEPHRINE HCL 10 MG/ML IJ SOLN
INTRAMUSCULAR | Status: DC | PRN
  Administered 2018-02-28: 80 ug via INTRAVENOUS

## 2018-02-28 MED ORDER — LITHIUM CARBONATE 300 MG PO CAPS
300.0000 mg | ORAL_CAPSULE | Freq: Two times a day (BID) | ORAL | Status: DC
  Filled 2018-02-28: qty 1

## 2018-02-28 MED ORDER — STERILE WATER FOR IRRIGATION IR SOLN
Status: DC | PRN
  Administered 2018-02-28: 500 mL

## 2018-02-28 MED ORDER — ROCURONIUM BROMIDE 50 MG/5ML IV SOSY
PREFILLED_SYRINGE | INTRAVENOUS | Status: DC | PRN
  Administered 2018-02-28: 50 mg via INTRAVENOUS

## 2018-02-28 MED ORDER — DEXTROSE-NACL 5-0.9 % IV SOLN
INTRAVENOUS | Status: DC
  Administered 2018-02-28 – 2018-03-01 (×2): via INTRAVENOUS

## 2018-02-28 MED ORDER — EPHEDRINE SULFATE 50 MG/ML IJ SOLN
INTRAMUSCULAR | Status: DC | PRN
  Administered 2018-02-28: 5 mg via INTRAVENOUS
  Administered 2018-02-28: 10 mg via INTRAVENOUS

## 2018-02-28 MED ORDER — SUGAMMADEX SODIUM 200 MG/2ML IV SOLN
INTRAVENOUS | Status: DC | PRN
  Administered 2018-02-28: 200 mg via INTRAVENOUS

## 2018-02-28 MED ORDER — PROPOFOL 500 MG/50ML IV EMUL
INTRAVENOUS | Status: DC | PRN
  Administered 2018-02-28: 10 ug/kg/min via INTRAVENOUS

## 2018-02-28 MED ORDER — ASPIRIN 81 MG PO CHEW
160.0000 mg | CHEWABLE_TABLET | Freq: Every day | ORAL | Status: DC
  Administered 2018-02-28 – 2018-03-01 (×2): 162 mg via ORAL
  Filled 2018-02-28 (×2): qty 2

## 2018-02-28 MED ORDER — 0.9 % SODIUM CHLORIDE (POUR BTL) OPTIME
TOPICAL | Status: DC | PRN
  Administered 2018-02-28: 1000 mL

## 2018-02-28 MED ORDER — OXYCODONE HCL 5 MG PO TABS: 5.0000 mg | ORAL_TABLET | Freq: Once | ORAL | Status: DC | PRN

## 2018-02-28 MED ORDER — ONDANSETRON HCL 4 MG/2ML IJ SOLN: 4.0000 mg | Freq: Once | INTRAMUSCULAR | Status: DC | PRN

## 2018-02-28 MED ORDER — IBUPROFEN 100 MG/5ML PO SUSP
400.0000 mg | Freq: Four times a day (QID) | ORAL | Status: DC | PRN
  Filled 2018-02-28: qty 20

## 2018-02-28 MED ORDER — ONDANSETRON HCL 4 MG/2ML IJ SOLN
INTRAMUSCULAR | Status: DC | PRN
  Administered 2018-02-28: 4 mg via INTRAVENOUS

## 2018-02-28 MED ORDER — LEVOTHYROXINE SODIUM 75 MCG PO TABS
75.0000 ug | ORAL_TABLET | Freq: Every day | ORAL | Status: DC
  Administered 2018-03-01: 75 ug via ORAL
  Filled 2018-02-28: qty 1

## 2018-02-28 MED ORDER — VITAMIN C 500 MG PO TABS
500.0000 mg | ORAL_TABLET | Freq: Every day | ORAL | Status: DC
  Administered 2018-02-28 – 2018-03-01 (×2): 500 mg via ORAL
  Filled 2018-02-28 (×2): qty 1

## 2018-02-28 MED ORDER — IBUPROFEN 200 MG PO TABS: 200.0000 mg | ORAL_TABLET | Freq: Four times a day (QID) | ORAL | Status: DC | PRN

## 2018-02-28 MED ORDER — FENTANYL CITRATE (PF) 100 MCG/2ML IJ SOLN
INTRAMUSCULAR | Status: DC | PRN
  Administered 2018-02-28 (×2): 50 ug via INTRAVENOUS
  Administered 2018-02-28: 100 ug via INTRAVENOUS

## 2018-02-28 SURGICAL SUPPLY — 45 items
ADH SKN CLS APL DERMABOND .7 (GAUZE/BANDAGES/DRESSINGS) ×1
BLADE SURG 15 STRL LF DISP TIS (BLADE) IMPLANT
BLADE SURG 15 STRL SS (BLADE)
CANISTER SUCT 3000ML PPV (MISCELLANEOUS) ×3 IMPLANT
CLEANER TIP ELECTROSURG 2X2 (MISCELLANEOUS) ×3 IMPLANT
CONT SPEC 4OZ CLIKSEAL STRL BL (MISCELLANEOUS) ×3 IMPLANT
CORDS BIPOLAR (ELECTRODE) ×3 IMPLANT
COVER SURGICAL LIGHT HANDLE (MISCELLANEOUS) ×3 IMPLANT
COVER WAND RF STERILE (DRAPES) ×3 IMPLANT
DERMABOND ADVANCED (GAUZE/BANDAGES/DRESSINGS) ×2
DERMABOND ADVANCED .7 DNX12 (GAUZE/BANDAGES/DRESSINGS) ×1 IMPLANT
DRAIN HEMOVAC 7FR (DRAIN) IMPLANT
DRAIN SNY 10 ROU (WOUND CARE) ×2 IMPLANT
DRAPE HALF SHEET 40X57 (DRAPES) IMPLANT
ELECT COATED BLADE 2.86 ST (ELECTRODE) ×3 IMPLANT
ELECT REM PT RETURN 9FT ADLT (ELECTROSURGICAL) ×3
ELECTRODE REM PT RTRN 9FT ADLT (ELECTROSURGICAL) ×1 IMPLANT
EVACUATOR SILICONE 100CC (DRAIN) ×3 IMPLANT
FORCEPS BIPOLAR SPETZLER 8 1.0 (NEUROSURGERY SUPPLIES) ×3 IMPLANT
GAUZE 4X4 16PLY RFD (DISPOSABLE) ×2 IMPLANT
GLOVE BIO SURGEON STRL SZ 6.5 (GLOVE) ×3 IMPLANT
GLOVE BIO SURGEONS STRL SZ 6.5 (GLOVE) ×3
GLOVE BIOGEL PI IND STRL 6.5 (GLOVE) IMPLANT
GLOVE BIOGEL PI INDICATOR 6.5 (GLOVE) ×4
GLOVE ECLIPSE 7.5 STRL STRAW (GLOVE) ×4 IMPLANT
GLOVE SURG SS PI 6.0 STRL IVOR (GLOVE) ×6 IMPLANT
GOWN STRL REUS W/ TWL LRG LVL3 (GOWN DISPOSABLE) ×2 IMPLANT
GOWN STRL REUS W/TWL LRG LVL3 (GOWN DISPOSABLE) ×15
KIT BASIN OR (CUSTOM PROCEDURE TRAY) ×3 IMPLANT
KIT TURNOVER KIT B (KITS) ×3 IMPLANT
NDL PRECISIONGLIDE 27X1.5 (NEEDLE) ×1 IMPLANT
NEEDLE PRECISIONGLIDE 27X1.5 (NEEDLE) ×3 IMPLANT
NS IRRIG 1000ML POUR BTL (IV SOLUTION) ×3 IMPLANT
PAD ARMBOARD 7.5X6 YLW CONV (MISCELLANEOUS) ×6 IMPLANT
PENCIL FOOT CONTROL (ELECTRODE) ×3 IMPLANT
SHEARS HARMONIC 9CM CVD (BLADE) ×3 IMPLANT
SOL PREP POV-IOD 4OZ 10% (MISCELLANEOUS) ×2 IMPLANT
STAPLER VISISTAT 35W (STAPLE) ×3 IMPLANT
SUT CHROMIC 3 0 PS 2 (SUTURE) ×3 IMPLANT
SUT CHROMIC 4 0 PS 2 18 (SUTURE) IMPLANT
SUT ETHILON 3 0 PS 1 (SUTURE) ×3 IMPLANT
SUT SILK 3 0 REEL (SUTURE) IMPLANT
SUT SILK 4 0 REEL (SUTURE) ×3 IMPLANT
TOWEL OR 17X24 6PK STRL BLUE (TOWEL DISPOSABLE) ×3 IMPLANT
TRAY ENT MC OR (CUSTOM PROCEDURE TRAY) ×3 IMPLANT

## 2018-02-28 NOTE — Progress Notes (Signed)
Received pt from PACU. Incision clean, dry, and intact. JP charged to suction. Patient alert and oriented x4. Oriented to room and call bell. Will continue to monitor.

## 2018-02-28 NOTE — Interval H&P Note (Signed)
History and Physical Interval Note:  02/28/2018 9:39 AM  Patrick FullerHubert L Ercole Jr.  has presented today for surgery, with the diagnosis of Goiter  The various methods of treatment have been discussed with the patient and family. After consideration of risks, benefits and other options for treatment, the patient has consented to  Procedure(s): right thyroidectomy, possible total with frozen section (Right) as a surgical intervention .  The patient's history has been reviewed, patient examined, no change in status, stable for surgery.  I have reviewed the patient's chart and labs.  Questions were answered to the patient's satisfaction.     [name]

## 2018-02-28 NOTE — Anesthesia Procedure Notes (Signed)
Procedure Name: Intubation Date/Time: 02/28/2018 10:52 AM Performed by: Epifanio LeschesMercer, Ellyse Rotolo L, CRNA Pre-anesthesia Checklist: Patient identified, Emergency Drugs available, Suction available and Patient being monitored Patient Re-evaluated:Patient Re-evaluated prior to induction Oxygen Delivery Method: Circle System Utilized Preoxygenation: Pre-oxygenation with 100% oxygen Induction Type: IV induction Ventilation: Mask ventilation without difficulty Tube type: Oral Tube size: 7.5 mm Number of attempts: 1 Airway Equipment and Method: Stylet Placement Confirmation: ETT inserted through vocal cords under direct vision,  positive ETCO2 and breath sounds checked- equal and bilateral Tube secured with: Tape Dental Injury: Teeth and Oropharynx as per pre-operative assessment  Comments: Intubation performed by Jeanine LuzSummer Farlow, SRNA.

## 2018-02-28 NOTE — Transfer of Care (Signed)
Immediate Anesthesia Transfer of Care Note  Patient: Patrick FullerHubert L Manard Jr.  Procedure(s) Performed: RIGHT THYROIDECTOMY with frozen section (Right Neck)  Patient Location: PACU  Anesthesia Type:General  Level of Consciousness: awake and patient cooperative  Airway & Oxygen Therapy: Patient Spontanous Breathing and Patient connected to face mask oxygen  Post-op Assessment: Report given to RN and Post -op Vital signs reviewed and stable  Post vital signs: Reviewed and stable  Last Vitals:  Vitals Value Taken Time  BP 124/77 02/28/2018 12:21 PM  Temp 36.5 C 02/28/2018 12:21 PM  Pulse 88 02/28/2018 12:24 PM  Resp 17 02/28/2018 12:24 PM  SpO2 94 % 02/28/2018 12:24 PM  Vitals shown include unvalidated device data.  Last Pain:  Vitals:   02/28/18 1221  TempSrc:   PainSc: 0-No pain         Complications: No apparent anesthesia complications

## 2018-02-28 NOTE — Progress Notes (Signed)
Day of Surgery   Subjective/Chief Complaint: Feeling well no complaints   Objective: Vital signs in last 24 hours: Temp:  [97.6 F (36.4 C)-97.9 F (36.6 C)] 97.8 F (36.6 C) (11/18 1818) Pulse Rate:  [70-92] 70 (11/18 1818) Resp:  [10-20] 18 (11/18 1818) BP: (112-132)/(73-86) 112/73 (11/18 1818) SpO2:  [96 %-100 %] 99 % (11/18 1818) Weight:  [83 kg] 83 kg (11/18 0909)    Intake/Output from previous day: No intake/output data recorded. Intake/Output this shift: Total I/O In: 982.9 [I.V.:782.9; Other:200] Out: 4461 [Drains:31; Blood:30]  wound excellent voice normal  Lab Results:  No results for input(s): WBC, HGB, HCT, PLT in the last 72 hours. BMET No results for input(s): NA, K, CL, CO2, GLUCOSE, BUN, CREATININE, CALCIUM in the last 72 hours. PT/INR No results for input(s): LABPROT, INR in the last 72 hours. ABG No results for input(s): PHART, HCO3 in the last 72 hours.  Invalid input(s): PCO2, PO2  Studies/Results: No results found.  Anti-infectives: Anti-infectives (From admission, onward)   None      Assessment/Plan: s/p Procedure(s): RIGHT THYROIDECTOMY with frozen section (Right) no issues continue care  LOS: 0 days    Suzanna ObeyJohn Gatha Mcnulty 02/28/2018

## 2018-02-28 NOTE — Op Note (Signed)
OPERATIVE REPORT  DATE OF SURGERY: 02/28/2018  PATIENT:  Patrick FullerHubert L Opfer Jr.,  62 y.o. male  PRE-OPERATIVE DIAGNOSIS:  Goiter  POST-OPERATIVE DIAGNOSIS:  Goiter  PROCEDURE:  Procedure(s): RIGHT THYROIDECTOMY with frozen section  SURGEON:  Susy FrizzleJefry H Ellean Firman, MD  ASSISTANTS: Aquilla HackerLouise Nordbladh PA  ANESTHESIA:   General   EBL: 30 ml  DRAINS: 10 French round JP  LOCAL MEDICATIONS USED:  None  SPECIMEN: Right thyroid lobectomy, frozen section negative for carcinoma.  COUNTS:  Correct  PROCEDURE DETAILS: The patient was taken to the operating room and placed on the operating table in the supine position. A shoulder roll was placed beneath the shoulder blades and the neck was extended. The neck was prepped and draped in a standard fashion. A low collar transverse incision was outlined with a marking pen and was incised with electrocautery. Dissection was continued down through the platysma layer.  Self-retaining retractors were used throughout the case.  The midline fascia was divided.  The strap muscles were reflected laterally on both sides exposing the thyroid isthmus and the right lobe.  The laryngotracheal complex was deviated about 2 or 3 cm to the left.  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  There is a very large complex goiter on the right including part of the isthmus.  The right side strap muscles were divided to facilitate exposure and to allow for removal of the large goiter.  The superior vasculature was separately identified and ligated between clamps and divided.  4-0 silk ties were used on the larger caliber vessels and the harmonic dissector was used for remaining tissue.  The lobe was then brought forward and finger dissection was used to retrieve the right lobe and dissected out of the surgical bed to expose the tracheoesophageal groove area.  Dissection continued around the posterior aspect of the gland staying right on the capsule.  The recurrent nerve was  identified and preserved.  A suspected parathyroid was also identified and preserved.  There is ligament was divided using the harmonic scalpel and then electrocautery.  There is a portion of the gland that projected inferiorly into the pretracheal area and this was dissected as well with the specimen.  The left side of the isthmus was divided using the harmonic dissector.  The gland was delivered and sent for frozen section analysis.  Hemostasis was completed throughout the surgical bed using 4-0 silk ties and bipolar cautery.  The drain was exited through separate stab incision at the midline inferior to the incision.  The drain was secured in place with a nylon suture.  The strap muscles were reapproximated with 3-0 chromic suture.  The midline fascia was reapproximated with interrupted chromic suture. The platysma layer was also reapproximated with interrupted chromic suture. A running subcuticular closure was accomplished. Dermabond was used on the skin. The drain was charged. The patient was awakened, extubated and transferred to recovery in stable condition.   PATIENT DISPOSITION:  To PACU, stable

## 2018-02-28 NOTE — Anesthesia Preprocedure Evaluation (Addendum)
Anesthesia Evaluation  Patient identified by MRN, date of birth, ID band Patient awake    Reviewed: Allergy & Precautions, NPO status , Patient's Chart, lab work & pertinent test results  Airway Mallampati: II  TM Distance: >3 FB Neck ROM: Full    Dental  (+) Teeth Intact, Dental Advisory Given   Pulmonary    breath sounds clear to auscultation       Cardiovascular  Rhythm:Regular Rate:Normal     Neuro/Psych    GI/Hepatic   Endo/Other    Renal/GU      Musculoskeletal   Abdominal   Peds  Hematology   Anesthesia Other Findings   Reproductive/Obstetrics                             Anesthesia Physical Anesthesia Plan  ASA: II  Anesthesia Plan: General   Post-op Pain Management:    Induction: Intravenous  PONV Risk Score and Plan: 2 and Ondansetron, Dexamethasone, Propofol infusion and Midazolam  Airway Management Planned: Oral ETT  Additional Equipment:   Intra-op Plan:   Post-operative Plan: Extubation in OR  Informed Consent: I have reviewed the patients History and Physical, chart, labs and discussed the procedure including the risks, benefits and alternatives for the proposed anesthesia with the patient or authorized representative who has indicated his/her understanding and acceptance.   Dental advisory given  Plan Discussed with: CRNA and Anesthesiologist  Anesthesia Plan Comments:         Anesthesia Quick Evaluation

## 2018-02-28 NOTE — Anesthesia Postprocedure Evaluation (Signed)
Anesthesia Post Note  Patient: Patrick FullerHubert L Dimmitt Jr.  Procedure(s) Performed: RIGHT THYROIDECTOMY with frozen section (Right Neck)     Patient location during evaluation: PACU Anesthesia Type: General Level of consciousness: awake and alert Pain management: pain level controlled Vital Signs Assessment: post-procedure vital signs reviewed and stable Respiratory status: spontaneous breathing, nonlabored ventilation, respiratory function stable and patient connected to nasal cannula oxygen Cardiovascular status: blood pressure returned to baseline and stable Postop Assessment: no apparent nausea or vomiting Anesthetic complications: no    Last Vitals:  Vitals:   02/28/18 1351 02/28/18 1459  BP: 122/77 115/76  Pulse: 72 77  Resp: 15 18  Temp:  36.4 C  SpO2: 98% 96%    Last Pain:  Vitals:   02/28/18 1459  TempSrc: Oral  PainSc:                  [name]

## 2018-03-01 ENCOUNTER — Encounter (HOSPITAL_COMMUNITY): Payer: Self-pay | Admitting: Otolaryngology

## 2018-03-01 DIAGNOSIS — E049 Nontoxic goiter, unspecified: Secondary | ICD-10-CM | POA: Diagnosis not present

## 2018-03-01 MED ORDER — HYDROCODONE-ACETAMINOPHEN 7.5-325 MG PO TABS: 1.0000 | ORAL_TABLET | Freq: Four times a day (QID) | ORAL | 0 refills | Status: DC | PRN

## 2018-03-01 NOTE — Progress Notes (Signed)
Patient discharged to home with instructions. 

## 2018-03-01 NOTE — Discharge Summary (Signed)
  Physician Discharge Summary  Patient ID: Patrick FullerHubert L Reny Jr. MRN: [id number] DOB/AGE: Jan 24, 1956 62 y.o.  Admit date: 02/28/2018 Discharge date: 03/01/2018  Admission Diagnoses: Thyroid goiter  Discharge Diagnoses:  Active Problems:   S/P partial thyroidectomy   Discharged Condition: good  Hospital Course: No complications  Consults: none  Significant Diagnostic Studies: none  Treatments: surgery: Right thyroid lobectomy  Discharge Exam: Blood pressure 106/62, pulse 60, temperature 98.2 F (36.8 C), temperature source Oral, resp. rate 20, height 5\' 8"  (1.727 m), weight 83 kg, SpO2 95 %. PHYSICAL EXAM: Awake and alert, eating and drinking without difficulty.  Surgical site looks excellent.  No swelling.  Voice normal.  JP drain removed.  Disposition:    Allergies as of 03/01/2018      Reactions   Penicillins Hives   Has patient had a PCN reaction causing immediate rash, facial/tongue/throat swelling, SOB or lightheadedness with hypotension: No Has patient had a PCN reaction causing severe rash involving mucus membranes or skin necrosis: No Has patient had a PCN reaction that required hospitalization: No Has patient had a PCN reaction occurring within the last 10 years: No If all of the above answers are "NO", then may proceed with Cephalosporin use.      Medication List    TAKE these medications   ACAI PO Take 2 tablets by mouth daily.   aspirin 81 MG tablet Take 160 mg by mouth daily.   buPROPion 200 MG 12 hr tablet Commonly known as:  WELLBUTRIN SR Take 400 mg by mouth daily.   CITRACAL/VITAMIN D PO Take 1 tablet by mouth daily.   CoQ-10 100 MG Caps Take 2 capsules by mouth daily.   D3 + K2 DOTS PO Take 1 tablet by mouth daily.   eszopiclone 2 MG Tabs tablet Commonly known as:  LUNESTA Take 2 mg by mouth at bedtime as needed for sleep.   Fish Oil 1200 MG Caps Take by mouth. 2 by mouth daily   GLUCOSAMINE 1500 COMPLEX PO Take 2 tablets by mouth  daily.   HYDROcodone-acetaminophen 7.5-325 MG tablet Commonly known as:  NORCO Take 1 tablet by mouth every 6 (six) hours as needed for moderate pain.   HYDROcodone-homatropine 5-1.5 MG/5ML syrup Commonly known as:  HYCODAN Take 5 mLs by mouth every 6 (six) hours as needed.   ibuprofen 200 MG tablet Commonly known as:  ADVIL,MOTRIN Take 200 mg by mouth every 6 (six) hours as needed for moderate pain.   levothyroxine 75 MCG tablet Commonly known as:  SYNTHROID, LEVOTHROID Take 75 mcg by mouth daily.   lithium carbonate 300 MG capsule Take 600 mg by mouth at bedtime.   ondansetron 4 MG tablet Commonly known as:  ZOFRAN Take 1 tablet (4 mg total) by mouth every 8 (eight) hours as needed for nausea or vomiting.   Saw Palmetto (Serenoa repens) 450 MG Caps Take by mouth. 2 by mouth daily   SUPER B COMPLEX PO Take 1 capsule by mouth daily.   vitamin C 500 MG tablet Commonly known as:  ASCORBIC ACID Take 500 mg by mouth daily.      Follow-up Information    Serena Colonelosen, Jamiyah Dingley, MD. Schedule an appointment as soon as possible for a visit in 1 week(s).   Specialty:  Otolaryngology Contact information: [address] [place] [address] [phone]           Signed: Serena ColonelJefry Laurabeth Yip 03/01/2018, 9:03 AM

## 2018-03-01 NOTE — Plan of Care (Signed)
  Problem: Education: Goal: Knowledge of General Education information will improve Description Including pain rating scale, medication(s)/side effects and non-pharmacologic comfort measures Outcome: Progressing   Problem: Health Behavior/Discharge Planning: Goal: Ability to manage health-related needs will improve Outcome: Progressing   Problem: Clinical Measurements: Goal: Ability to maintain clinical measurements within normal limits will improve Outcome: Progressing Goal: Will remain free from infection Outcome: Progressing Goal: Diagnostic test results will improve Outcome: Progressing Goal: Cardiovascular complication will be avoided Outcome: Progressing   Problem: Activity: Goal: Risk for activity intolerance will decrease Outcome: Progressing   Problem: Nutrition: Goal: Adequate nutrition will be maintained Outcome: Progressing   Problem: Coping: Goal: Level of anxiety will decrease Outcome: Progressing   Problem: Elimination: Goal: Will not experience complications related to bowel motility Outcome: Progressing Goal: Will not experience complications related to urinary retention Outcome: Progressing   Problem: Pain Managment: Goal: General experience of comfort will improve Outcome: Progressing   Problem: Safety: Goal: Ability to remain free from injury will improve Outcome: Progressing   Problem: Skin Integrity: Goal: Risk for impaired skin integrity will decrease Outcome: Progressing   Problem: Education: Goal: Knowledge of the prescribed therapeutic regimen will improve Outcome: Progressing   Problem: Clinical Measurements: Goal: Complications related to the disease process, condition or treatment will be avoided or minimized Outcome: Progressing  Sonny Mastersebecca C Inaara Tye, RN 03/01/2018

## 2018-03-01 NOTE — Discharge Instructions (Signed)
You may shower and use soap and water. Do not use any creams, oils or ointment.  Avoid any strenuous physical activity for 2 weeks.

## 2018-07-07 ENCOUNTER — Other Ambulatory Visit: Payer: Self-pay | Admitting: Endocrinology

## 2018-07-07 DIAGNOSIS — E890 Postprocedural hypothyroidism: Secondary | ICD-10-CM

## 2018-08-31 ENCOUNTER — Other Ambulatory Visit: Payer: Medicare Other

## 2019-06-07 IMAGING — CT CT NECK W/ CM
5 of 6 series · 14 of 33 positions shown, 16 images · IV contrast (iopamidol)
Comparison: Thyroid ultrasound 07/23/2014

CLINICAL DATA: 62-year-old male with chronic goiter. Treated with
radioiodine ablation in 2550. Increasing dysphagia.

Creatinine was obtained on site at [HOSPITAL] at [REDACTED].
Results: Creatinine 1.0 mg/dL.
EXAM:
CT NECK WITH CONTRAST
TECHNIQUE: Multidetector CT imaging of the neck was performed using the
standard protocol following the bolus administration of intravenous
contrast.
CONTRAST:  75mL ABDGMY-RZZ IOPAMIDOL (ABDGMY-RZZ) INJECTION 61%

[Series 3: neck 2.00 br40 s3 ax st · axial · 0.44mm/px · z∈[-744,-655]mm · 2 of 135 slices shown, 3 images]
[im 45/135  soft-tissue]
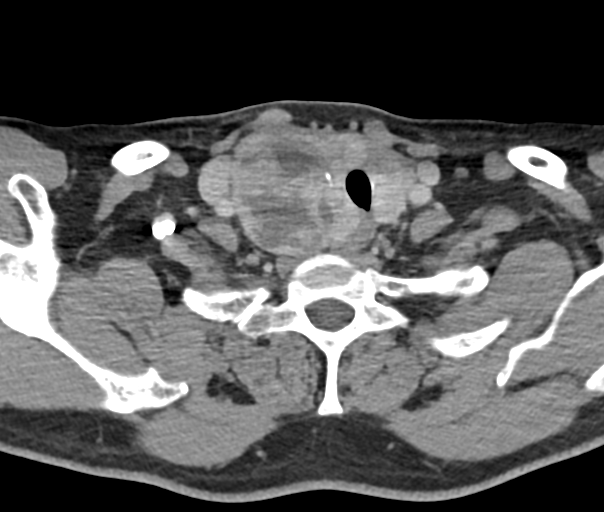
[im 45/135  bone]
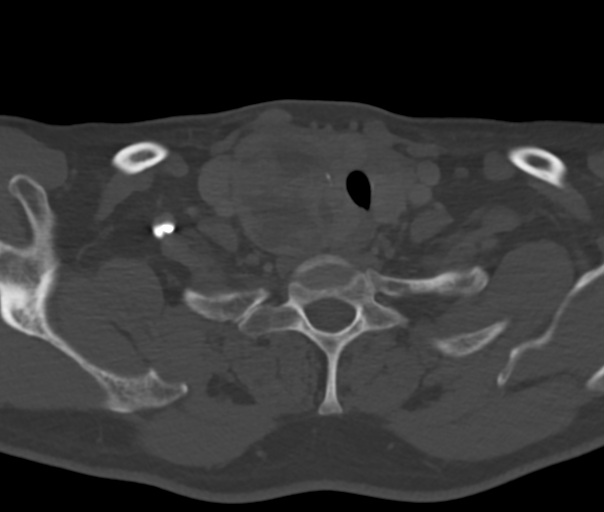
[im 90/135  bone]
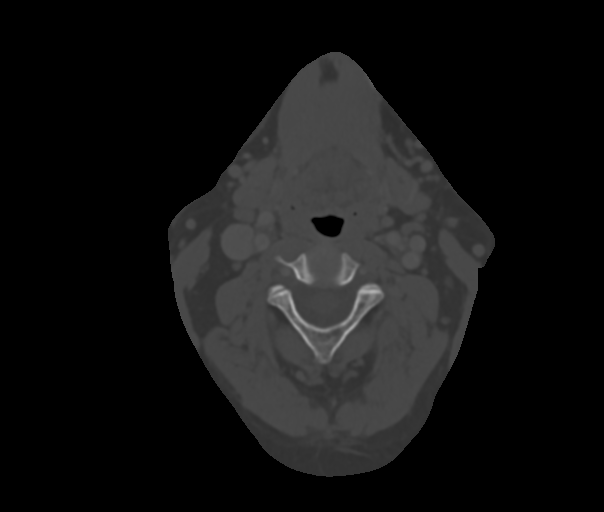

[Series 5: neck 2.00 br36 s3 ax hyoid · axial · 0.44mm/px · z∈[-656,-587]mm · 2 of 111 slices shown]
[im 37/111  bone]
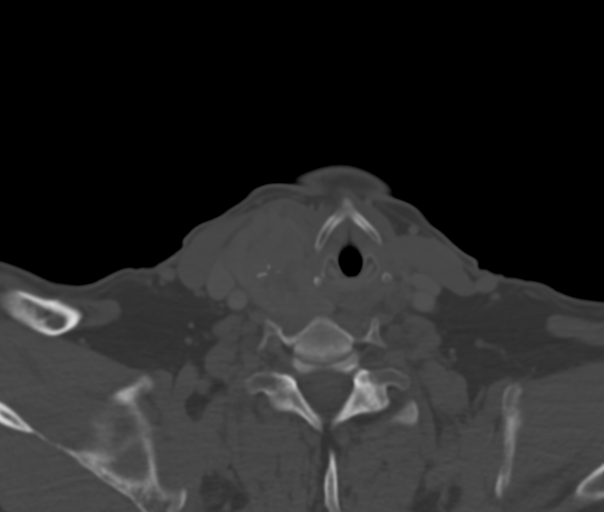
[im 74/111  bone]
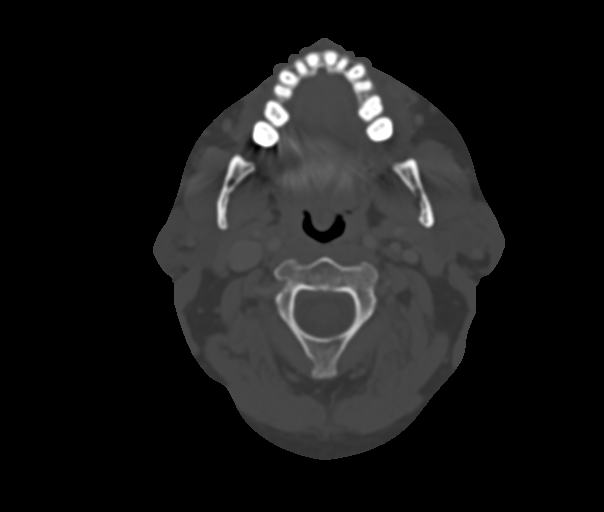

[Series 7: neck 2.00 br60 s3 ax bone · axial · 0.44mm/px · z∈[-732,-642]mm · 2 of 135 slices shown]
[im 45/135  bone]
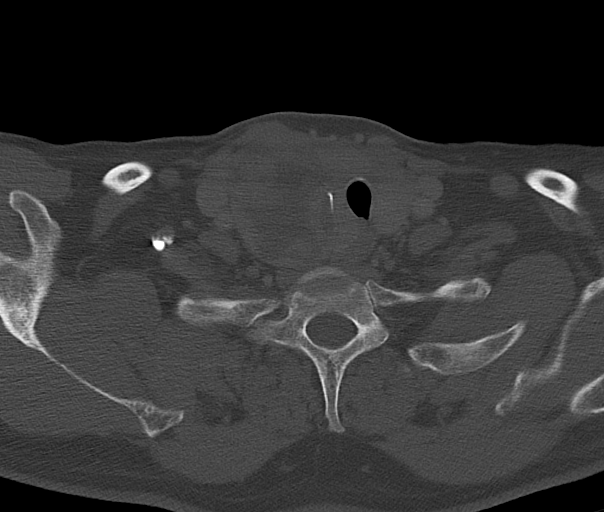
[im 90/135  bone]
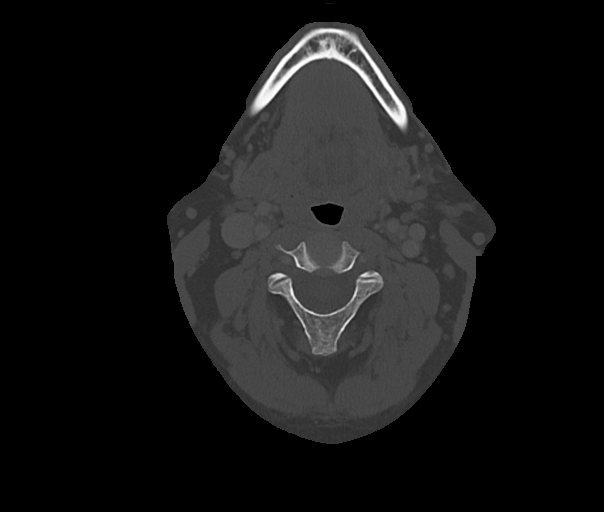

[Series 11: neck 2.00 br40 s3 (person_name) · coronal · 0.51mm/px · 3 of 111 slices shown]
[im 23/111  bone]
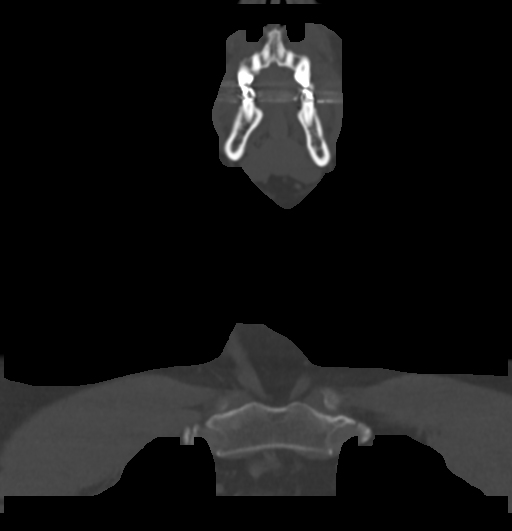
[im 45/111  bone]
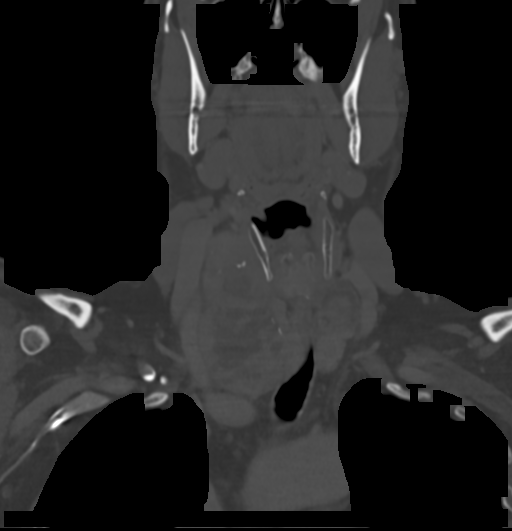
[im 67/111  bone]
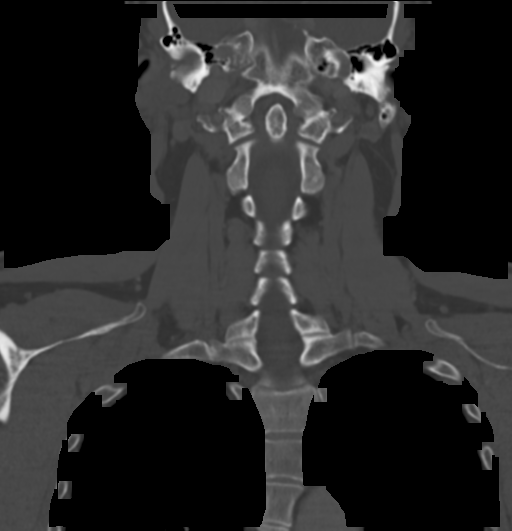

[Series 13: neck 2.00 br40 s3 sag st · sagittal · 0.44mm/px · 5 of 131 slices shown, 6 images]
[im 44/131  bone]
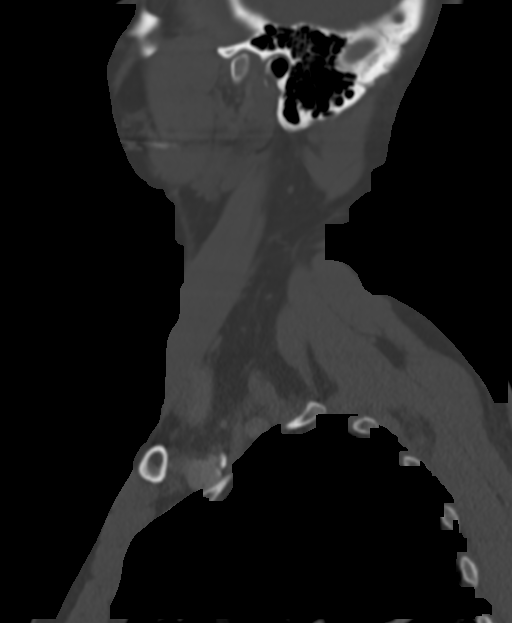
[im 55/131  bone]
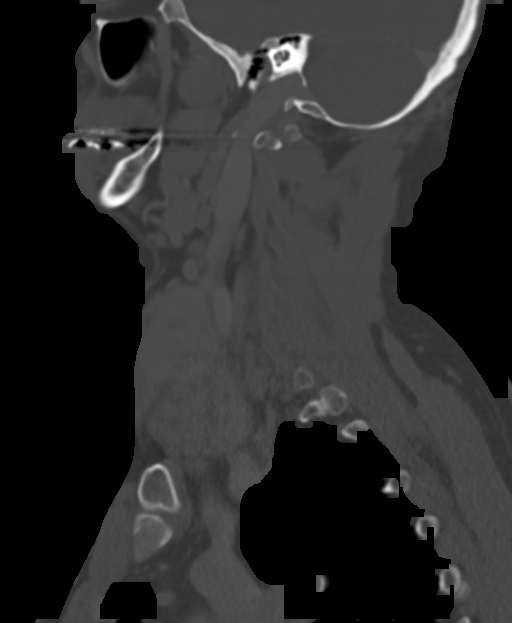
[im 66/131  soft-tissue]
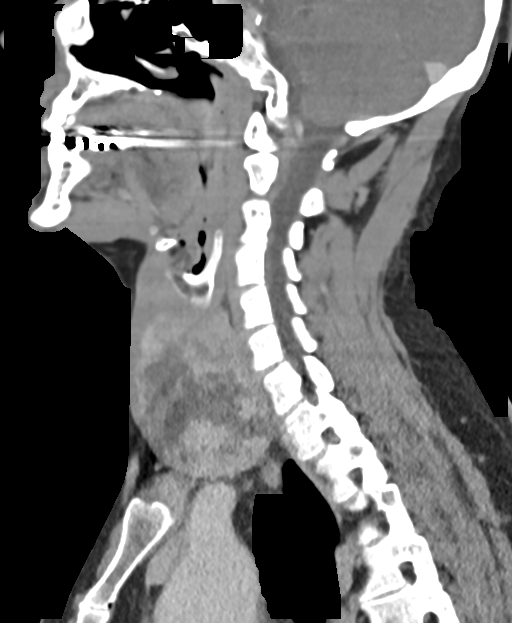
[im 66/131  bone]
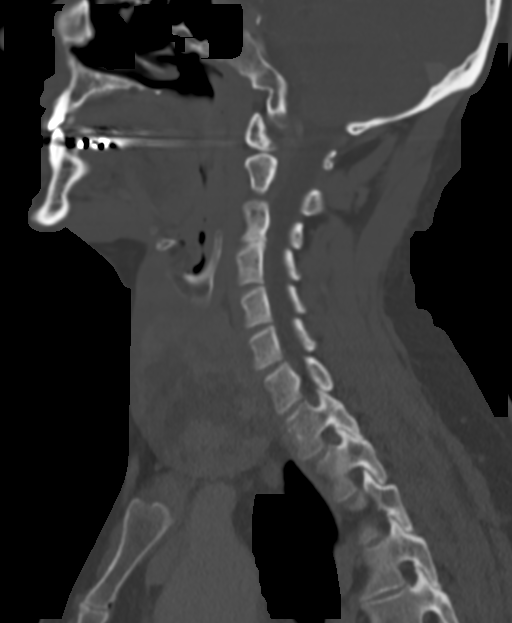
[im 76/131  bone]
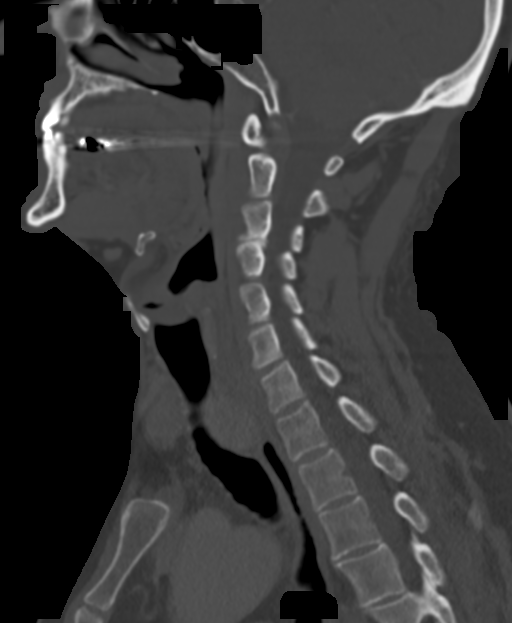
[im 87/131  bone]
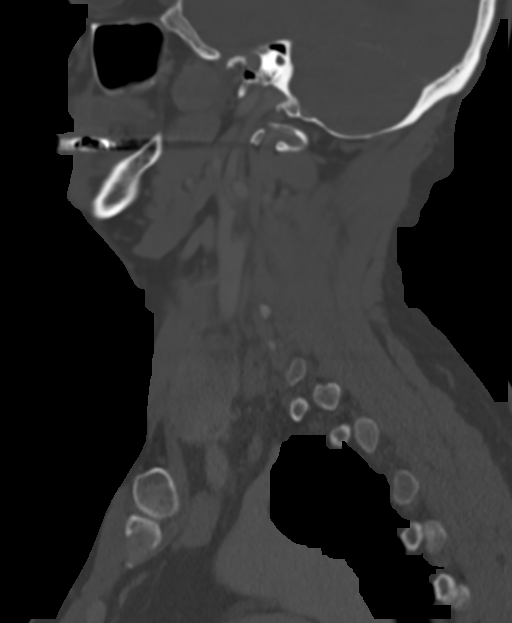

[14 of 33 positions shown; findings below may reference images not displayed]

FINDINGS: Pharynx and larynx: The glottis is closed. Laryngeal soft tissue
contours are within normal limits. Pharyngeal soft tissue contours
and parapharyngeal spaces are normal. Negative retropharyngeal
space.

Salivary glands: Negative sublingual space. Bilateral submandibular
and parotid glands appear symmetric and within normal limits.

Thyroid: Marked right lobe heterogeneous thyroid enlargement. The
right lobe encompasses 56 x 52 x 89 millimeters (AP by transverse by
CC) for an estimated volume of 130 milliliters. The right lobe
extends to the thoracic inlet but not retrosternal.

The isthmus is thickened up to 12 millimeters. The left lobe is
smaller and approaches normal size encompassing 31 x 22 x 51
millimeters (estimated volume 17 milliliters). Hypodense and
partially calcified right lobe nodules are estimated at up to 45
millimeters diameter individually.

There is associated mass effect at the thoracic inlet with leftward
shift of the trachea and esophagus, but no significant airway
narrowing (coronal image 43).

No malignant appearing adjacent soft tissue plane invasion. No
regional lymphadenopathy.

Lymph nodes: Negative. No lymphadenopathy. No cystic or calcified
nodes.

Vascular: Major vascular structures in the neck and at the skull
base are patent. There is lateral mass effect on the carotid spaces,
more so on the right. Mild for age, minimal visible carotid
atherosclerosis.

Limited intracranial: Negative.

Visualized orbits: Negative.

Mastoids and visualized paranasal sinuses: Visualized paranasal
sinuses and mastoids are well pneumatized.

Skeleton: No acute osseous abnormality identified.

Upper chest: Negative visible superior mediastinum. Normal lung
parenchyma aside from mild atelectasis. Normal visible axillary
lymph nodes.
IMPRESSION: 1. Thyroid goiter with moderate to severe enlargement of the right
lobe.
The right lobe measures up to 8.9 cm long axis with an estimated
volume of 130 mL. By comparison the more normal size left thyroid
lobe volume estimate is 17 mL.
Regional mass effect including leftward shift of the trachea and
esophagus at the thoracic inlet, but no significant airway
narrowing.
No lymphadenopathy or malignant features by CT.
2. Otherwise negative neck soft tissues.

## 2019-06-22 IMAGING — CR DG CHEST 2V
2 series · 2 of 2 positions shown · non-contrast
Comparison: PA and lateral chest 12/13/2017.

CLINICAL DATA: Preoperative examination. Patient for thyroid
surgery.

EXAM:
CHEST - 2 VIEW

[w chest pa]
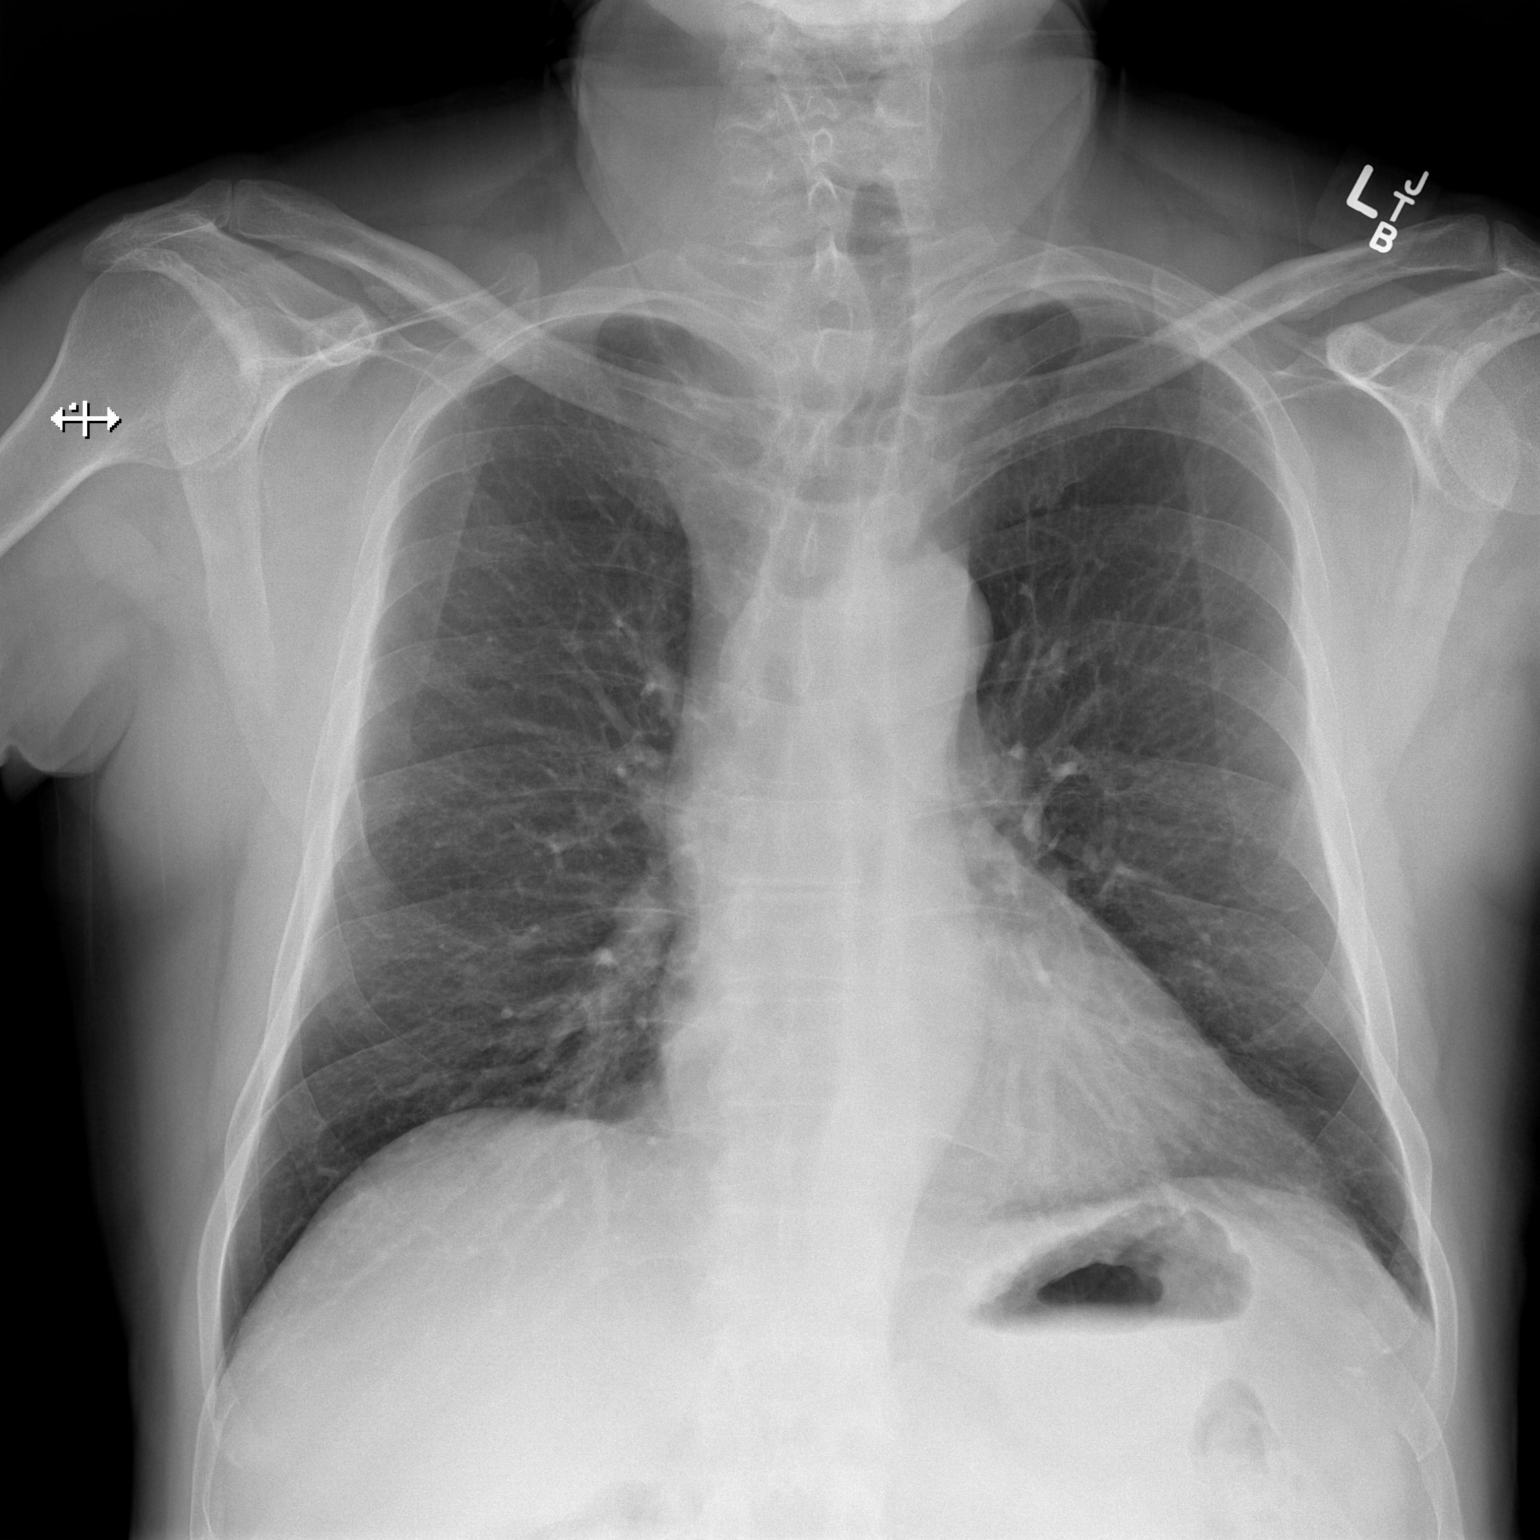

[w chest lat]
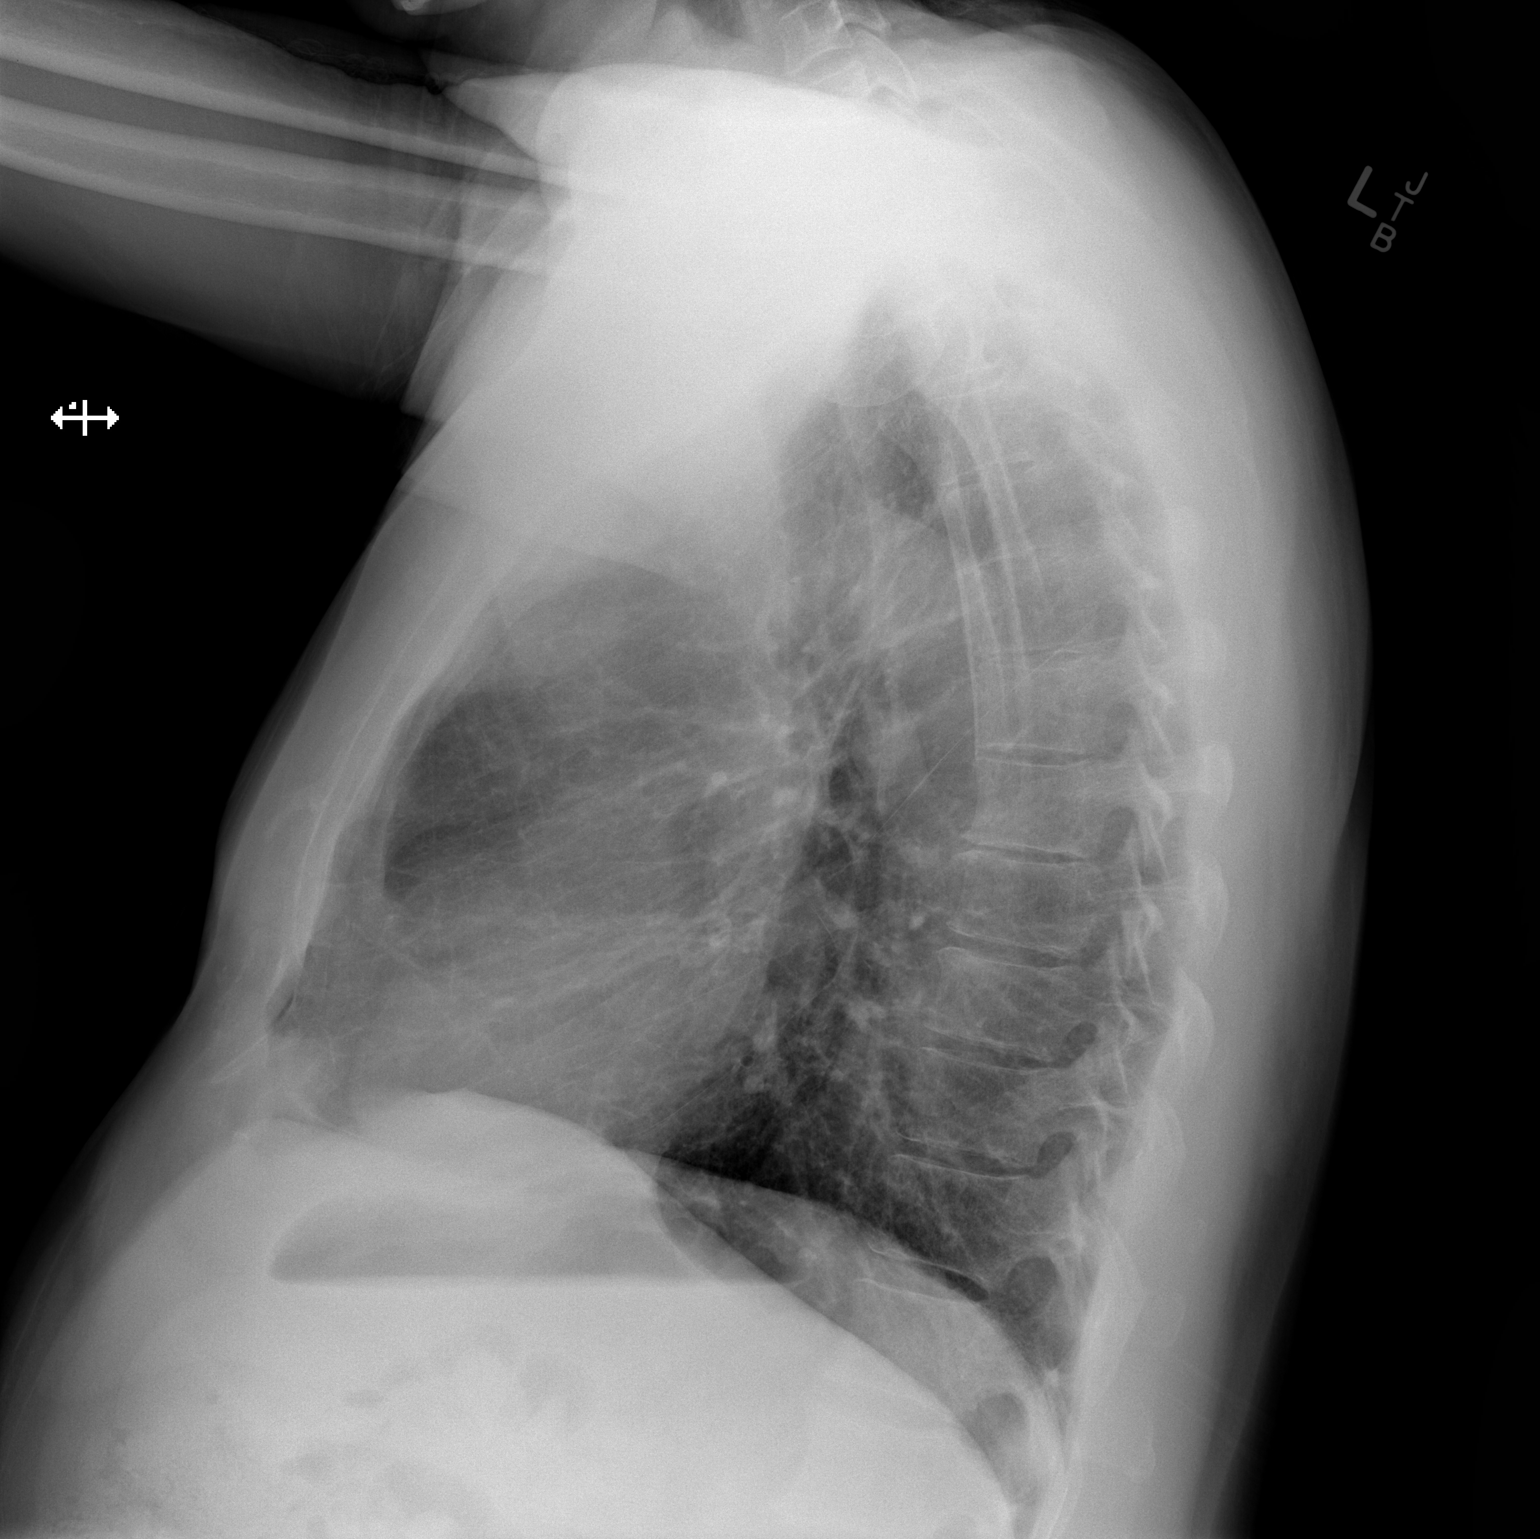

[2 of 2 positions shown; findings below may reference images not displayed]

FINDINGS: Lungs clear. Heart size normal. No pneumothorax or pleural effusion.
Deviation of the trachea to the left is consistent with an enlarged
right lobe of the thyroid. No acute or focal bony abnormality.
IMPRESSION: No acute disease.

## 2019-11-06 ENCOUNTER — Other Ambulatory Visit: Payer: Self-pay | Admitting: General Surgery

## 2019-11-09 ENCOUNTER — Encounter: Payer: Self-pay | Admitting: Physical Therapy

## 2019-11-09 ENCOUNTER — Other Ambulatory Visit: Payer: Self-pay

## 2019-11-09 ENCOUNTER — Ambulatory Visit: Payer: Medicare Other | Attending: Family Medicine | Admitting: Physical Therapy

## 2019-11-09 DIAGNOSIS — M6281 Muscle weakness (generalized): Secondary | ICD-10-CM | POA: Insufficient documentation

## 2019-11-09 DIAGNOSIS — M79661 Pain in right lower leg: Secondary | ICD-10-CM | POA: Diagnosis not present

## 2019-11-09 DIAGNOSIS — M62831 Muscle spasm of calf: Secondary | ICD-10-CM | POA: Insufficient documentation

## 2019-11-09 NOTE — Patient Instructions (Signed)
Access Code: [facility] URL: [email or web address] Date: 11/09/2019 Prepared by: Lavinia Sharps  Exercises Seated Gastroc Stretch with Strap - 1 x daily - 7 x weekly - 1 sets - 3 reps Gastroc Stretch on Wall - 1 x daily - 7 x weekly - 1 sets - 3 reps Long Sitting Ankle PNF D2 AROM - 1 x daily - 7 x weekly - 1 sets - 10 reps Long Sitting Ankle PNF D1 AROM - 1 x daily - 7 x weekly - 1 sets - 10 reps Supine Ankle Dorsiflexion and Plantarflexion AROM - 1 x daily - 7 x weekly - 1 sets - 10 reps   Trigger Point Dry Needling  . What is Trigger Point Dry Needling (DN)? o DN is a physical therapy technique used to treat muscle pain and dysfunction. Specifically, DN helps deactivate muscle trigger points (muscle knots).  o A thin filiform needle is used to penetrate the skin and stimulate the underlying trigger point. The goal is for a local twitch response (LTR) to occur and for the trigger point to relax. No medication of any kind is injected during the procedure.   . What Does Trigger Point Dry Needling Feel Like?  o The procedure feels different for each individual patient. Some patients report that they do not actually feel the needle enter the skin and overall the process is not painful. Very mild bleeding may occur. However, many patients feel a deep cramping in the muscle in which the needle was inserted. This is the local twitch response.   Marland Kitchen How Will I feel after the treatment? o Soreness is normal, and the onset of soreness may not occur for a few hours. Typically this soreness does not last longer than two days.  o Bruising is uncommon, however; ice can be used to decrease any possible bruising.  o In rare cases feeling tired or nauseous after the treatment is normal. In addition, your symptoms may get worse before they get better, this period will typically not last longer than 24 hours.   . What Can I do After My Treatment? o Increase your hydration by drinking more water  for the next 24 hours. o You may place ice or heat on the areas treated that have become sore, however, do not use heat on inflamed or bruised areas. Heat often brings more relief post needling. o You can continue your regular activities, but vigorous activity is not recommended initially after the treatment for 24 hours. o DN is best combined with other physical therapy such as strengthening, stretching, and other therapies.

## 2019-11-10 NOTE — Therapy (Signed)
[facility] [address] [place], [place], [address] Phone: [phone]   Fax:  [phone]  Physical Therapy Treatment  Patient Details  Name: Patrick Webster. MRN: [id number] Date of Birth: Aug 05, 1955 Referring Provider (PT): Jarrett Soho PA   Encounter Date: 11/09/2019   PT End of Session - 11/09/19 1955    Visit Number 1    Date for PT Re-Evaluation 01/04/20    Authorization Type Medicare    PT Start Time 0930    PT Stop Time 1015    PT Time Calculation (min) 45 min    Activity Tolerance Patient tolerated treatment well           Past Medical History:  Diagnosis Date  . Complication of anesthesia   . Depression   . Family history of adverse reaction to anesthesia    mother gets N/V  . Hyperthyroidism    S/P ablation  . Pneumonia   . PONV (postoperative nausea and vomiting)     Past Surgical History:  Procedure Laterality Date  . COLONOSCOPY    . EYE SURGERY     detached retina  . RETINAL DETACHMENT SURGERY     OD  . THYROIDECTOMY Right 02/28/2018   Procedure: RIGHT THYROIDECTOMY with frozen section;  Surgeon: Serena Colonel, MD;  Location: [facility] OR;  Service: ENT;  Laterality: Right;  . TONSILLECTOMY AND ADENOIDECTOMY    . WISDOM TOOTH EXTRACTION      There were no vitals filed for this visit.   Subjective Assessment - 11/09/19 0932    Subjective PIckleball injury last Tuesday.  Felt it pop.  Blistered myself from ice.  No pain at rest, just with walking.  I can get up on my toes a little.  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    Pertinent History upcoming hernia surgery in November    Limitations Walking    How long can you walk comfortably? just around the house but no intentional walking    Diagnostic tests none    Patient Stated Goals back to normal;  pickleball    Currently in Pain? Yes    Pain Score 3     Pain Location Calf    Pain Orientation Right    Pain Type Acute pain     Aggravating Factors  twisting; walking;  now I can go straight up    Pain Relieving Factors stretch              [facility] PT Assessment - 11/10/19 0001      Assessment   Medical Diagnosis right calf injury     Referring Provider (PT) Jarrett Soho PA    Onset Date/Surgical Date 10/31/19    Next MD Visit as needed    Prior Therapy none      Precautions   Precautions None      Restrictions   Weight Bearing Restrictions No      Home Environment   Living Environment Private residence      Prior Function   Level of Independence Independent    Vocation Full time employment    Vocation Requirements real estate     Leisure pickleball       Observation/Other Assessments   Skin Integrity blister/burn from ice pack right medial gastroc     Focus on Therapeutic Outcomes (FOTO)  42% limitation       Posture/Postural Control   Posture Comments mild bruising mid gastroc and heel cord region  AROM   Overall AROM Comments single leg balance on right 7 sec     Right Ankle Dorsiflexion 8    Right Ankle Plantar Flexion 60    Right Ankle Inversion 30    Right Ankle Eversion 30      Strength   Overall Strength Comments pt able to do 1 rep bil heel raise     Right Ankle Dorsiflexion 4+/5    Right Ankle Plantar Flexion 3+/5    Right Ankle Inversion 4+/5    Right Ankle Eversion 4+/5      Flexibility   Soft Tissue Assessment /Muscle Length --   dec gastroc muscle length      Palpation   Palpation comment tender points medial gastroc       Thompson's Test   Findings Negative      Ambulation/Gait   Gait Comments normal gait pattern on level surfaces short distance                         [facility] Adult PT Treatment/Exercise - 11/10/19 0001      Moist Heat Therapy   Moist Heat Location Other (comment)   right calf     Manual Therapy   Soft tissue mobilization gastroc            Trigger Point Dry Needling - 11/10/19 0001    Consent Given? Yes     Muscles Treated Lower Quadrant Gastrocnemius    Gastrocnemius Response Twitch response elicited;Palpable increased muscle length                PT Education - 11/09/19 1958    Education Details stretching, ankle DF/PF; dry needling after care    Person(s) Educated Patient    Methods Explanation;Demonstration;Handout    Comprehension Verbalized understanding;Returned demonstration            PT Short Term Goals - 11/09/19 2001      PT SHORT TERM GOAL #1   Title The patient will demonstrate knowledge of initial HEP and basic self care strategies to promote healing    Time 4    Period Weeks    Status New    Target Date 12/07/19             PT Long Term Goals - 11/09/19 2001      PT LONG TERM GOAL #1   Title The patient will be independent in safe self progression of HEP    Time 8    Period Weeks    Status New    Target Date 01/04/20      PT LONG TERM GOAL #2   Title The patient will have ankle dorsiflexion to 10 degrees as needed for shag dancing    Time 8    Period Weeks    Status New      PT LONG TERM GOAL #3   Title Plantarflexion strength to 4/5 needed for dancing, walking longer distances    Time 8    Period Weeks    Status New      PT LONG TERM GOAL #4   Title FOTO functional outcome score improved from 42% to 22% indicating improved function with less pain    Time 8    Period Weeks    Status New                 Plan - 11/09/19 1954    Clinical Impression Statement The patient injured his right calf  1 1/2 weeks ago while playing pickleball, felt a pop.  Initial pain medial calf closer to the back of the knee.  Now the pain is mid calf region.  Initially he could hardly walk but now he reports he can walk shorter distances.  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  Mild bruising near Achilles and mid calf .  He has a half dollar size abrasion and redness he states is from an ice pack burn/blister.  Well developed gastroc  muscle mass symmetrical with left.  He is able to do 1 rep of bil heel raise.  Decreased single leg balance time on right.  Decreased ankle dorsiflexion to 8 degrees but other ankle and knee ROM WNLs.  Plantarflexion strength 3+/5.  Negative Thompson test.  Tender points in medial gastroc muscle.  He would benefit from PT to address these deficits and promote healing.    Personal Factors and Comorbidities --    Examination-Activity Limitations Locomotion Level;Stand;Squat    Examination-Participation Restrictions Community Activity;Occupation    Stability/Clinical Decision Making Stable/Uncomplicated    Clinical Decision Making Low    Rehab Potential Good    PT Frequency 1x / week    PT Duration 8 weeks    PT Treatment/Interventions ADLs/Self Care Home Management;Cryotherapy;Electrical Stimulation;Ultrasound;Moist Heat;Iontophoresis 4mg /ml Dexamethasone;Functional mobility training;Therapeutic activities;Therapeutic exercise;Neuromuscular re-education;Manual techniques;Patient/family education;Dry needling;Vasopneumatic Device;Taping    PT Next Visit Plan assess response to DN#1 to medial gastroc;  review initial HEP;  manual therapy; KT    PT Home Exercise Plan [facility]    Consulted and Agree with Plan of Care Patient           Patient will benefit from skilled therapeutic intervention in order to improve the following deficits and impairments:  Decreased range of motion, Increased fascial restricitons, Difficulty walking, Pain, Impaired flexibility, Decreased strength  Visit Diagnosis: Pain in right lower leg - Plan: PT plan of care cert/re-cert  Muscle weakness (generalized) - Plan: PT plan of care cert/re-cert  Muscle spasm of calf - Plan: PT plan of care cert/re-cert     Problem List Patient Active Problem List   Diagnosis Date Noted  . S/P partial thyroidectomy 02/28/2018  . HYPERTHYROIDISM 03/23/2008  . HYPERLIPIDEMIA 03/23/2008  . BIPOLAR DISORDER UNSPECIFIED 03/23/2008    . ERECTILE DYSFUNCTION 03/23/2008  . GOITER, NODULAR 02/12/2000   13/04/1999, PT 11/10/19 9:10 AM Phone: [phone] Fax: [phone] 11/10/2019, 9:10 AM  [facility] [address] [place], [place], [place] Phone: [phone]   Fax:  [phone]  Name: Patrick Webster. MRN: Marvell Fuller Date of Birth: 1955/05/26

## 2019-11-17 ENCOUNTER — Other Ambulatory Visit: Payer: Self-pay

## 2019-11-17 ENCOUNTER — Ambulatory Visit: Payer: Medicare Other | Attending: Family Medicine | Admitting: Physical Therapy

## 2019-11-17 DIAGNOSIS — M79661 Pain in right lower leg: Secondary | ICD-10-CM | POA: Diagnosis not present

## 2019-11-17 DIAGNOSIS — M6281 Muscle weakness (generalized): Secondary | ICD-10-CM | POA: Insufficient documentation

## 2019-11-17 DIAGNOSIS — M62831 Muscle spasm of calf: Secondary | ICD-10-CM | POA: Insufficient documentation

## 2019-11-17 NOTE — Therapy (Signed)
[facility] [address] [place], [place], [address] Phone: [phone]   Fax:  [phone]  Physical Therapy Treatment  Patient Details  Name: Patrick Webster. MRN: [id number] Date of Birth: 01-Dec-1955 Referring Provider (PT): Jarrett Soho PA   Encounter Date: 11/17/2019   PT End of Session - 11/17/19 0912    Visit Number 2    Date for PT Re-Evaluation 01/04/20    Authorization Type Medicare    PT Start Time 0801    PT Stop Time 0844    PT Time Calculation (min) 43 min    Activity Tolerance Patient tolerated treatment well           Past Medical History:  Diagnosis Date  . Complication of anesthesia   . Depression   . Family history of adverse reaction to anesthesia    mother gets N/V  . Hyperthyroidism    S/P ablation  . Pneumonia   . PONV (postoperative nausea and vomiting)     Past Surgical History:  Procedure Laterality Date  . COLONOSCOPY    . EYE SURGERY     detached retina  . RETINAL DETACHMENT SURGERY     OD  . THYROIDECTOMY Right 02/28/2018   Procedure: RIGHT THYROIDECTOMY with frozen section;  Surgeon: Serena Colonel, MD;  Location: [facility] OR;  Service: ENT;  Laterality: Right;  . TONSILLECTOMY AND ADENOIDECTOMY    . WISDOM TOOTH EXTRACTION      There were no vitals filed for this visit.   Subjective Assessment - 11/17/19 0803    Subjective About 85-90% better.  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  I've been doing some leg presses at the gym and heel raises every day.  My plantar fascitis bothers me more than this does now.  I don't think I can play pickleball yet.    Currently in Pain? No/denies    Pain Score 0-No pain    Pain Location Calf    Pain Orientation Right                             [facility] Adult PT Treatment/Exercise - 11/17/19 0001      Moist Heat Therapy   Moist Heat Location Other (comment)   right calf     Manual  Therapy   Soft tissue mobilization gastroc medial and lateral      Ankle Exercises: Standing   Heel Raises Both;10 reps    Heel Raises Limitations toes out, toes in and holding 2 10# weights       Ankle Exercises: Seated   Other Seated Ankle Exercises isometric plantarflexion against wall 10 sec holds also with toes out focus      Ankle Exercises: Stretches   Plantar Fascia Stretch 3 reps;60 seconds    Plantar Fascia Stretch Limitations hands assist seated            Trigger Point Dry Needling - 11/17/19 0001    Consent Given? Yes    Muscles Treated Lower Quadrant Gastrocnemius    Gastrocnemius Response Twitch response elicited;Palpable increased muscle length                PT Education - 11/17/19 0911    Education Details heel raises holding weights; toes out and in; seated plantar fascia stretch    Person(s) Educated Patient    Methods Explanation;Demonstration;Handout    Comprehension Returned demonstration;Verbalized understanding  PT Short Term Goals - 11/17/19 0916      PT SHORT TERM GOAL #1   Title The patient will demonstrate knowledge of initial HEP and basic self care strategies to promote healing    Status Achieved             PT Long Term Goals - 11/09/19 2001      PT LONG TERM GOAL #1   Title The patient will be independent in safe self progression of HEP    Time 8    Period Weeks    Status New    Target Date 01/04/20      PT LONG TERM GOAL #2   Title The patient will have ankle dorsiflexion to 10 degrees as needed for shag dancing    Time 8    Period Weeks    Status New      PT LONG TERM GOAL #3   Title Plantarflexion strength to 4/5 needed for dancing, walking longer distances    Time 8    Period Weeks    Status New      PT LONG TERM GOAL #4   Title FOTO functional outcome score improved from 42% to 22% indicating improved function with less pain    Time 8    Period Weeks    Status New                  Plan - 11/17/19 0912    Clinical Impression Statement The patient reports significant reduction in pain, improved ambulation and return to dancing.  Decreased bruising but tender points and fascial retrictions noted in medial > lateral gastroc.  Following manual therapy and DN much improved soft tissue noted.  He is able to progress ex's to include loaded strengthening with no pain.  Therapist monitoring response with all interventions.    Examination-Activity Limitations Locomotion Level;Stand;Squat    Examination-Participation Restrictions Community Activity;Occupation    Rehab Potential Good    PT Frequency 1x / week    PT Duration 8 weeks    PT Treatment/Interventions ADLs/Self Care Home Management;Cryotherapy;Electrical Stimulation;Ultrasound;Moist Heat;Iontophoresis 4mg /ml Dexamethasone;Functional mobility training;Therapeutic activities;Therapeutic exercise;Neuromuscular re-education;Manual techniques;Patient/family education;Dry needling;Vasopneumatic Device;Taping    PT Next Visit Plan assess response to DN#2 to gastroc;  progress to single limb strengthening    PT Home Exercise Plan [facility]           Patient will benefit from skilled therapeutic intervention in order to improve the following deficits and impairments:  Decreased range of motion, Increased fascial restricitons, Difficulty walking, Pain, Impaired flexibility, Decreased strength  Visit Diagnosis: Pain in right lower leg  Muscle weakness (generalized)  Muscle spasm of calf     Problem List Patient Active Problem List   Diagnosis Date Noted  . S/P partial thyroidectomy 02/28/2018  . HYPERTHYROIDISM 03/23/2008  . HYPERLIPIDEMIA 03/23/2008  . BIPOLAR DISORDER UNSPECIFIED 03/23/2008  . ERECTILE DYSFUNCTION 03/23/2008  . GOITER, NODULAR 02/12/2000   13/04/1999, PT 11/17/19 9:18 AM Phone: [phone] Fax: [phone] 11/17/2019, 9:17 AM  [facility] [address] [place], [place], [place] Phone: [phone]   Fax:  [phone]  Name: Patrick Webster. MRN: Patrick Webster Date of Birth: 13-Jun-1955

## 2019-11-17 NOTE — Patient Instructions (Signed)
Access Code: [facility] URL: [email or web address] Date: 11/17/2019 Prepared by: Lavinia Sharps  Exercises Seated Gastroc Stretch with Strap - 1 x daily - 7 x weekly - 1 sets - 3 reps Gastroc Stretch on Wall - 1 x daily - 7 x weekly - 1 sets - 3 reps Long Sitting Ankle PNF D2 AROM - 1 x daily - 7 x weekly - 1 sets - 10 reps Long Sitting Ankle PNF D1 AROM - 1 x daily - 7 x weekly - 1 sets - 10 reps Supine Ankle Dorsiflexion and Plantarflexion AROM - 1 x daily - 7 x weekly - 1 sets - 10 reps Seated Plantar Fascia Stretch - 5 x daily - 7 x weekly - 1 sets - 3 reps - 60 hold Long Sitting Isometric Ankle Plantarflexion with Ball at Wall - 1 x daily - 7 x weekly - 1 sets - 10 reps - 10 hold Standing Heel Raise - 1 x daily - 7 x weekly - 1 sets - 10 reps Standing Heel Raise with Toes Turned Out - 1 x daily - 7 x weekly - 1 sets - 10 reps Standing Heel Raise with Toes Turned In - 1 x daily - 7 x weekly - 1 sets - 10 reps

## 2019-11-22 ENCOUNTER — Other Ambulatory Visit: Payer: Self-pay

## 2019-11-22 ENCOUNTER — Ambulatory Visit: Payer: Medicare Other

## 2019-11-22 DIAGNOSIS — M79661 Pain in right lower leg: Secondary | ICD-10-CM

## 2019-11-22 DIAGNOSIS — M62831 Muscle spasm of calf: Secondary | ICD-10-CM

## 2019-11-22 DIAGNOSIS — M6281 Muscle weakness (generalized): Secondary | ICD-10-CM

## 2019-11-22 NOTE — Therapy (Signed)
[facility] [address] [place], [place], [address] Phone: [phone]   Fax:  [phone]  Physical Therapy Treatment  Patient Details  Name: Patrick Webster. MRN: [id number] Date of Birth: 1955/10/25 Referring Provider (PT): Jarrett Soho PA   Encounter Date: 11/22/2019   PT End of Session - 11/22/19 1136    Visit Number 3    Date for PT Re-Evaluation 01/04/20    Authorization Type Medicare    PT Start Time 1101    PT Stop Time 1130    PT Time Calculation (min) 29 min    Activity Tolerance Patient tolerated treatment well    Behavior During Therapy [facility] for tasks assessed/performed           Past Medical History:  Diagnosis Date  . Complication of anesthesia   . Depression   . Family history of adverse reaction to anesthesia    mother gets N/V  . Hyperthyroidism    S/P ablation  . Pneumonia   . PONV (postoperative nausea and vomiting)     Past Surgical History:  Procedure Laterality Date  . COLONOSCOPY    . EYE SURGERY     detached retina  . RETINAL DETACHMENT SURGERY     OD  . THYROIDECTOMY Right 02/28/2018   Procedure: RIGHT THYROIDECTOMY with frozen section;  Surgeon: Serena Colonel, MD;  Location: [facility] OR;  Service: ENT;  Laterality: Right;  . TONSILLECTOMY AND ADENOIDECTOMY    . WISDOM TOOTH EXTRACTION      There were no vitals filed for this visit.   Subjective Assessment - 11/22/19 1102    Subjective I'm still feeling really good.  I did the bike, jogged and did weights.    Currently in Pain? No/denies                             [facility] Adult PT Treatment/Exercise - 11/22/19 0001      Manual Therapy   Soft tissue mobilization gastroc elongation            Trigger Point Dry Needling - 11/22/19 0001    Consent Given? Yes    Muscles Treated Lower Quadrant Gastrocnemius    Gastrocnemius Response Twitch response elicited;Palpable increased muscle length                   PT Short Term Goals - 11/17/19 0916      PT SHORT TERM GOAL #1   Title The patient will demonstrate knowledge of initial HEP and basic self care strategies to promote healing    Status Achieved             PT Long Term Goals - 11/22/19 1151      PT LONG TERM GOAL #1   Title The patient will be independent in safe self progression of HEP    Time 8    Period Weeks    Status On-going      PT LONG TERM GOAL #2   Title The patient will have ankle dorsiflexion to 10 degrees as needed for shag dancing    Time 8    Period Weeks    Status On-going      PT LONG TERM GOAL #3   Title Plantarflexion strength to 4/5 needed for dancing, walking longer distances    Time 8    Period Weeks    Status On-going  Plan - 11/22/19 1149    Clinical Impression Statement Pt reports continued improvement in the Rt gastroc. Pt has returned to all gym activities, weights and running and denies any pain. Pt has not returned to playing pickleball yet.  PT advised pt to try to do low impact play to determine if any symptoms.  Pt is able to perform heel raise on the Rt x 10 without difficulty or pain today.  Pt with medial Rt gastroc>Lt stiffness and tension today and demonstrated improved tissue mobility and reduced stiffness after manual therapy today.  Pt is independent in HEP and pt didn't have any questions or require further advancement today.  Pt will attempt pickleball this week and cancel next appointment if he does well with this.  He will keep his final appt either way for dry needling and final HEP update.    PT Frequency 1x / week    PT Duration 8 weeks    PT Treatment/Interventions ADLs/Self Care Home Management;Cryotherapy;Electrical Stimulation;Ultrasound;Moist Heat;Iontophoresis 4mg /ml Dexamethasone;Functional mobility training;Therapeutic activities;Therapeutic exercise;Neuromuscular re-education;Manual techniques;Patient/family education;Dry  needling;Vasopneumatic Device;Taping    PT Next Visit Plan assess response to DN#3  to gastroc;  1-2 sessions probable to allow for return to pickelball without pain and full Rt gastroc strength and A/ROM.    PT Home Exercise Plan [facility]    Consulted and Agree with Plan of Care Patient           Patient will benefit from skilled therapeutic intervention in order to improve the following deficits and impairments:  Decreased range of motion, Increased fascial restricitons, Difficulty walking, Pain, Impaired flexibility, Decreased strength  Visit Diagnosis: Pain in right lower leg  Muscle weakness (generalized)  Muscle spasm of calf     Problem List Patient Active Problem List   Diagnosis Date Noted  . S/P partial thyroidectomy 02/28/2018  . HYPERTHYROIDISM 03/23/2008  . HYPERLIPIDEMIA 03/23/2008  . BIPOLAR DISORDER UNSPECIFIED 03/23/2008  . ERECTILE DYSFUNCTION 03/23/2008  . GOITER, NODULAR 02/12/2000     13/04/1999, PT 11/22/19 11:53 AM  [facility] [address] [place], [place], [place] Phone: [phone]   Fax:  [phone]  Name: Gomer France. MRN: Marvell Fuller Date of Birth: Oct 28, 1955

## 2019-11-30 ENCOUNTER — Ambulatory Visit: Payer: Medicare Other | Admitting: Physical Therapy

## 2019-11-30 ENCOUNTER — Other Ambulatory Visit: Payer: Self-pay

## 2019-11-30 DIAGNOSIS — M79661 Pain in right lower leg: Secondary | ICD-10-CM | POA: Diagnosis not present

## 2019-11-30 DIAGNOSIS — M62831 Muscle spasm of calf: Secondary | ICD-10-CM

## 2019-11-30 DIAGNOSIS — M6281 Muscle weakness (generalized): Secondary | ICD-10-CM

## 2019-11-30 NOTE — Therapy (Addendum)
[facility] [address] [place], [place], [address] Phone: [phone]   Fax:  [phone]  Physical Therapy Treatment  Patient Details  Name: Patrick Webster. MRN: [id number] Date of Birth: 09/03/1955 Referring Provider (PT): Marda Stalker PA   Encounter Date: 11/30/2019   PT End of Session - 11/30/19 0921    Visit Number 4    Date for PT Re-Evaluation 01/04/20    Authorization Type Medicare    PT Start Time 0847    PT Stop Time 0923    PT Time Calculation (min) 36 min    Activity Tolerance Patient tolerated treatment well           Past Medical History:  Diagnosis Date  . Complication of anesthesia   . Depression   . Family history of adverse reaction to anesthesia    mother gets N/V  . Hyperthyroidism    S/P ablation  . Pneumonia   . PONV (postoperative nausea and vomiting)     Past Surgical History:  Procedure Laterality Date  . COLONOSCOPY    . EYE SURGERY     detached retina  . RETINAL DETACHMENT SURGERY     OD  . THYROIDECTOMY Right 02/28/2018   Procedure: RIGHT THYROIDECTOMY with frozen section;  Surgeon: Izora Gala, MD;  Location: [place];  Service: ENT;  Laterality: Right;  . TONSILLECTOMY AND ADENOIDECTOMY    . WISDOM TOOTH EXTRACTION      There were no vitals filed for this visit.   Subjective Assessment - 11/30/19 0848    Subjective Played pickleball2x this week.  Doing 20 min on treadmill  and bike and leg weights.  It feels good.  Only tight, no pain.    Pertinent History upcoming hernia surgery in November    Currently in Pain? No/denies    Pain Score 0-No pain    Pain Location Calf              [facility] PT Assessment - 11/30/19 0001      Observation/Other Assessments   Focus on Therapeutic Outcomes (FOTO)  1%      AROM   Right Ankle Dorsiflexion 10    Right Ankle Plantar Flexion 60    Right Ankle Inversion 30    Right Ankle Eversion 30      Strength   Overall  Strength Comments able to do 5 single leg heel raises     Right Ankle Dorsiflexion 5/5    Right Ankle Plantar Flexion 4+/5    Right Ankle Inversion 5/5    Right Ankle Eversion 5/5                         [facility] Adult PT Treatment/Exercise - 11/30/19 0001      Moist Heat Therapy   Number Minutes Moist Heat 3 Minutes    Moist Heat Location Other (comment)   right calf     Manual Therapy   Soft tissue mobilization gastroc elongation      Ankle Exercises: Seated   Other Seated Ankle Exercises discussion of dynamic warm ups prior to pickleball;  benefits of both static and dynamic stretching ; discussion of HEP progression and gym progression to decrease incidence of future injuries           Trigger Point Dry Needling - 11/30/19 0001    Consent Given? Yes    Muscles Treated Lower Quadrant Gastrocnemius    Gastrocnemius Response Twitch  response elicited;Palpable increased muscle length                  PT Short Term Goals - 11/30/19 5974      PT SHORT TERM GOAL #1   Title The patient will demonstrate knowledge of initial HEP and basic self care strategies to promote healing    Status Achieved             PT Long Term Goals - 11/30/19 0850      PT LONG TERM GOAL #1   Title The patient will be independent in safe self progression of HEP    Time 8    Period Weeks    Status On-going      PT LONG TERM GOAL #2   Title The patient will have ankle dorsiflexion to 10 degrees as needed for shag dancing    Time 8    Status Achieved      PT LONG TERM GOAL #3   Title Plantarflexion strength to 4/5 needed for dancing, walking longer distances    Status Achieved      PT LONG TERM GOAL #4   Title FOTO functional outcome score improved from 42% to 22% indicating improved function with less pain    Status Achieved                 Plan - 11/30/19 1613    Clinical Impression Statement The patient demonstrates much improved pain level and 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  He has symmetrical ankle dorsiflexion ROM but reports some period "tightness" in his gastrocnemius muscles (medial side).  He is able to perform 5 single leg heel raises. Excellent improvement in FOTO functional outcome score with a self perceived disability rating of 1%.  He has decreased overall tender points but still present primarily mid-belly of  medial gastroc.  Patient would like to play pickleball a couple more times to assess his lower leg strength and pain.  Anticipate discharge next visit.    Rehab Potential Good    PT Frequency 1x / week    PT Duration 8 weeks    PT Treatment/Interventions ADLs/Self Care Home Management;Cryotherapy;Electrical Stimulation;Ultrasound;Moist Heat;Iontophoresis 53m/ml Dexamethasone;Functional mobility training;Therapeutic activities;Therapeutic exercise;Neuromuscular re-education;Manual techniques;Patient/family education;Dry needling;Vasopneumatic Device;Taping    PT Next Visit Plan probable discharge next visit;  check SLS balance;  Dn as needed    PT Home Exercise Plan [facility]          Patient will benefit from skilled therapeutic intervention in order to improve the following deficits and impairments:  Decreased range of motion, Increased fascial restricitons, Difficulty walking, Pain, Impaired flexibility, Decreased strength  Visit Diagnosis: Pain in right lower leg  Muscle weakness (generalized)  Muscle spasm of calf     Problem List Patient Active Problem List   Diagnosis Date Noted  . S/P partial thyroidectomy 02/28/2018  . HYPERTHYROIDISM 03/23/2008  . HYPERLIPIDEMIA 03/23/2008  . BIPOLAR DISORDER UNSPECIFIED 03/23/2008  . ERECTILE DYSFUNCTION 03/23/2008  . GOITER, NODULAR 02/12/2000   SRuben Im PT 11/30/19 4:23 PM Phone: [phone]: [phone] Singh8/19/2021, 4:21 PM PHYSICAL THERAPY DISCHARGE  SUMMARY  Visits from Start of Care: 4  Current functional level related to goals / functional outcomes: See above for most current PT status. Pt didn't return after final session.     Remaining deficits: See above- at final session, pt didn't have any significant disability and discharge was planned  for the next session.     Education / Equipment: HEP Plan: Patient agrees to discharge.  Patient goals were partially met. Patient is being discharged due to being pleased with the current functional level.  ?????        Sigurd Sos, PT 03/19/20 10:22 AM  [facility] [address], [place], [place], [address] Phone: [phone]   Fax:  [phone]  Name: Aithan Farrelly. MRN: [id number] Date of Birth: 1955/07/24

## 2019-12-08 ENCOUNTER — Ambulatory Visit: Payer: Medicare Other | Admitting: Physical Therapy

## 2020-01-24 ENCOUNTER — Other Ambulatory Visit: Payer: Self-pay | Admitting: Endocrinology

## 2020-01-25 ENCOUNTER — Other Ambulatory Visit: Payer: Self-pay | Admitting: Endocrinology

## 2020-01-25 DIAGNOSIS — E890 Postprocedural hypothyroidism: Secondary | ICD-10-CM

## 2020-02-01 ENCOUNTER — Ambulatory Visit
Admission: RE | Admit: 2020-02-01 | Discharge: 2020-02-01 | Disposition: A | Discharge status: Home / Self Care | Payer: Medicare Other | Source: Ambulatory Visit | Attending: Endocrinology | Admitting: Endocrinology

## 2020-02-01 DIAGNOSIS — E890 Postprocedural hypothyroidism: Secondary | ICD-10-CM

## 2020-02-16 ENCOUNTER — Encounter (HOSPITAL_BASED_OUTPATIENT_CLINIC_OR_DEPARTMENT_OTHER): Payer: Self-pay | Admitting: General Surgery

## 2020-02-16 ENCOUNTER — Other Ambulatory Visit: Payer: Self-pay

## 2020-02-19 ENCOUNTER — Other Ambulatory Visit (HOSPITAL_COMMUNITY)
Admission: RE | Admit: 2020-02-19 | Discharge: 2020-02-19 | Disposition: A | Discharge status: Home / Self Care | Payer: Medicare Other | Source: Ambulatory Visit | Attending: General Surgery | Admitting: General Surgery

## 2020-02-19 DIAGNOSIS — Z20822 Contact with and (suspected) exposure to covid-19: Secondary | ICD-10-CM | POA: Insufficient documentation

## 2020-02-19 DIAGNOSIS — Z01818 Encounter for other preprocedural examination: Secondary | ICD-10-CM | POA: Diagnosis present

## 2020-02-19 LAB — SARS CORONAVIRUS 2 (TAT 6-24 HRS): SARS Coronavirus 2: NEGATIVE

## 2020-02-21 ENCOUNTER — Other Ambulatory Visit: Payer: Self-pay | Admitting: General Surgery

## 2020-02-21 NOTE — Anesthesia Preprocedure Evaluation (Addendum)
Anesthesia Evaluation  Patient identified by MRN, date of birth, ID band Patient awake    Reviewed: Allergy & Precautions, NPO status , Patient's Chart, lab work & pertinent test results  History of Anesthesia Complications (+) PONV  Airway Mallampati: II  TM Distance: >3 FB Neck ROM: Full    Dental no notable dental hx. (+) Teeth Intact, Dental Advisory Given   Pulmonary    Pulmonary exam normal breath sounds clear to auscultation       Cardiovascular Exercise Tolerance: Good Normal cardiovascular exam Rhythm:Regular Rate:Normal     Neuro/Psych PSYCHIATRIC DISORDERS Depression negative neurological ROS     GI/Hepatic negative GI ROS, Neg liver ROS,   Endo/Other    Renal/GU negative Renal ROS     Musculoskeletal   Abdominal   Peds  Hematology   Anesthesia Other Findings   Reproductive/Obstetrics                           Anesthesia Physical Anesthesia Plan  ASA: II  Anesthesia Plan: General   Post-op Pain Management:  Regional for Post-op pain   Induction:   PONV Risk Score and Plan: Treatment may vary due to age or medical condition, Ondansetron and Dexamethasone  Airway Management Planned: LMA  Additional Equipment: None  Intra-op Plan:   Post-operative Plan:   Informed Consent: I have reviewed the patients History and Physical, chart, labs and discussed the procedure including the risks, benefits and alternatives for the proposed anesthesia with the patient or authorized representative who has indicated his/her understanding and acceptance.     Dental advisory given  Plan Discussed with: CRNA and Anesthesiologist  Anesthesia Plan Comments: (LMA w TAP)       Anesthesia Quick Evaluation

## 2020-02-22 ENCOUNTER — Ambulatory Visit (HOSPITAL_BASED_OUTPATIENT_CLINIC_OR_DEPARTMENT_OTHER): Payer: Medicare Other | Admitting: Anesthesiology

## 2020-02-22 ENCOUNTER — Encounter (HOSPITAL_BASED_OUTPATIENT_CLINIC_OR_DEPARTMENT_OTHER): Payer: Self-pay | Admitting: General Surgery

## 2020-02-22 ENCOUNTER — Encounter (HOSPITAL_BASED_OUTPATIENT_CLINIC_OR_DEPARTMENT_OTHER)
Admission: RE | Disposition: A | Discharge status: Home / Self Care | Payer: Self-pay | Source: Home / Self Care | Attending: General Surgery

## 2020-02-22 ENCOUNTER — Ambulatory Visit (HOSPITAL_BASED_OUTPATIENT_CLINIC_OR_DEPARTMENT_OTHER)
Admission: RE | Admit: 2020-02-22 | Discharge: 2020-02-22 | Disposition: A | Discharge status: Home / Self Care | Payer: Medicare Other | Attending: General Surgery | Admitting: General Surgery

## 2020-02-22 DIAGNOSIS — K429 Umbilical hernia without obstruction or gangrene: Secondary | ICD-10-CM | POA: Insufficient documentation

## 2020-02-22 DIAGNOSIS — K4090 Unilateral inguinal hernia, without obstruction or gangrene, not specified as recurrent: Secondary | ICD-10-CM | POA: Diagnosis not present

## 2020-02-22 HISTORY — PX: UMBILICAL HERNIA REPAIR: SHX196

## 2020-02-22 HISTORY — PX: INGUINAL HERNIA REPAIR: SHX194

## 2020-02-22 SURGERY — REPAIR, HERNIA, UMBILICAL, ADULT
Anesthesia: General | Site: Groin

## 2020-02-22 MED ORDER — FENTANYL CITRATE (PF) 100 MCG/2ML IJ SOLN
INTRAMUSCULAR | Status: DC | PRN
  Administered 2020-02-22 (×2): 25 ug via INTRAVENOUS

## 2020-02-22 MED ORDER — DEXAMETHASONE SODIUM PHOSPHATE 10 MG/ML IJ SOLN
INTRAMUSCULAR | Status: AC
  Filled 2020-02-22: qty 1

## 2020-02-22 MED ORDER — SUCCINYLCHOLINE CHLORIDE 200 MG/10ML IV SOSY
PREFILLED_SYRINGE | INTRAVENOUS | Status: AC
  Filled 2020-02-22: qty 10

## 2020-02-22 MED ORDER — AMISULPRIDE (ANTIEMETIC) 5 MG/2ML IV SOLN: 10.0000 mg | Freq: Once | INTRAVENOUS | Status: DC | PRN

## 2020-02-22 MED ORDER — MIDAZOLAM HCL 2 MG/2ML IJ SOLN
INTRAMUSCULAR | Status: AC
  Filled 2020-02-22: qty 2

## 2020-02-22 MED ORDER — HYDROMORPHONE HCL 1 MG/ML IJ SOLN
INTRAMUSCULAR | Status: AC
  Filled 2020-02-22: qty 0.5

## 2020-02-22 MED ORDER — OXYCODONE HCL 5 MG PO TABS: 5.0000 mg | ORAL_TABLET | Freq: Once | ORAL | Status: DC | PRN

## 2020-02-22 MED ORDER — ACETAMINOPHEN 500 MG PO TABS
1000.0000 mg | ORAL_TABLET | ORAL | Status: AC
  Administered 2020-02-22: 1000 mg via ORAL

## 2020-02-22 MED ORDER — LIDOCAINE 2% (20 MG/ML) 5 ML SYRINGE
INTRAMUSCULAR | Status: AC
  Filled 2020-02-22: qty 5

## 2020-02-22 MED ORDER — KETOROLAC TROMETHAMINE 30 MG/ML IJ SOLN
15.0000 mg | Freq: Once | INTRAMUSCULAR | Status: AC | PRN
  Administered 2020-02-22: 15 mg via INTRAVENOUS

## 2020-02-22 MED ORDER — ONDANSETRON HCL 4 MG/2ML IJ SOLN
INTRAMUSCULAR | Status: AC
  Filled 2020-02-22: qty 2

## 2020-02-22 MED ORDER — HYDROMORPHONE HCL 1 MG/ML IJ SOLN
0.2500 mg | INTRAMUSCULAR | Status: DC | PRN
  Administered 2020-02-22 (×2): 0.5 mg via INTRAVENOUS

## 2020-02-22 MED ORDER — CEFAZOLIN SODIUM-DEXTROSE 2-4 GM/100ML-% IV SOLN
INTRAVENOUS | Status: AC
  Filled 2020-02-22: qty 100

## 2020-02-22 MED ORDER — CEFAZOLIN SODIUM-DEXTROSE 2-4 GM/100ML-% IV SOLN
2.0000 g | INTRAVENOUS | Status: AC
  Administered 2020-02-22: 2 g via INTRAVENOUS

## 2020-02-22 MED ORDER — SCOPOLAMINE 1 MG/3DAYS TD PT72
MEDICATED_PATCH | TRANSDERMAL | Status: AC
  Filled 2020-02-22: qty 1

## 2020-02-22 MED ORDER — FENTANYL CITRATE (PF) 100 MCG/2ML IJ SOLN
INTRAMUSCULAR | Status: AC
  Filled 2020-02-22: qty 2

## 2020-02-22 MED ORDER — PHENYLEPHRINE 40 MCG/ML (10ML) SYRINGE FOR IV PUSH (FOR BLOOD PRESSURE SUPPORT)
PREFILLED_SYRINGE | INTRAVENOUS | Status: AC
  Filled 2020-02-22: qty 10

## 2020-02-22 MED ORDER — DEXAMETHASONE SODIUM PHOSPHATE 4 MG/ML IJ SOLN
INTRAMUSCULAR | Status: DC | PRN
  Administered 2020-02-22: 10 mg via INTRAVENOUS

## 2020-02-22 MED ORDER — MIDAZOLAM HCL 5 MG/5ML IJ SOLN
INTRAMUSCULAR | Status: DC | PRN
  Administered 2020-02-22: 2 mg via INTRAVENOUS

## 2020-02-22 MED ORDER — LIDOCAINE HCL (CARDIAC) PF 100 MG/5ML IV SOSY
PREFILLED_SYRINGE | INTRAVENOUS | Status: DC | PRN
  Administered 2020-02-22: 75 mg via INTRAVENOUS

## 2020-02-22 MED ORDER — ONDANSETRON HCL 4 MG/2ML IJ SOLN: 4.0000 mg | Freq: Once | INTRAMUSCULAR | Status: DC | PRN

## 2020-02-22 MED ORDER — ROPIVACAINE HCL 5 MG/ML IJ SOLN
INTRAMUSCULAR | Status: DC | PRN
  Administered 2020-02-22: 30 mL

## 2020-02-22 MED ORDER — CLONIDINE HCL (ANALGESIA) 100 MCG/ML EP SOLN
EPIDURAL | Status: DC | PRN
  Administered 2020-02-22: 100 ug

## 2020-02-22 MED ORDER — OXYCODONE HCL 5 MG/5ML PO SOLN: 5.0000 mg | Freq: Once | ORAL | Status: DC | PRN

## 2020-02-22 MED ORDER — ENSURE PRE-SURGERY PO LIQD: 296.0000 mL | Freq: Once | ORAL | Status: DC

## 2020-02-22 MED ORDER — PROPOFOL 10 MG/ML IV BOLUS
INTRAVENOUS | Status: DC | PRN
  Administered 2020-02-22: 200 mg via INTRAVENOUS

## 2020-02-22 MED ORDER — DEXMEDETOMIDINE HCL IN NACL 200 MCG/50ML IV SOLN
INTRAVENOUS | Status: DC | PRN
  Administered 2020-02-22: 12 ug via INTRAVENOUS
  Administered 2020-02-22: 4 ug via INTRAVENOUS

## 2020-02-22 MED ORDER — LACTATED RINGERS IV SOLN: INTRAVENOUS | Status: DC

## 2020-02-22 MED ORDER — EPHEDRINE 5 MG/ML INJ
INTRAVENOUS | Status: AC
  Filled 2020-02-22: qty 10

## 2020-02-22 MED ORDER — KETOROLAC TROMETHAMINE 30 MG/ML IJ SOLN
INTRAMUSCULAR | Status: AC
  Filled 2020-02-22: qty 1

## 2020-02-22 MED ORDER — ACETAMINOPHEN 500 MG PO TABS
ORAL_TABLET | ORAL | Status: AC
  Filled 2020-02-22: qty 2

## 2020-02-22 MED ORDER — ONDANSETRON HCL 4 MG/2ML IJ SOLN
INTRAMUSCULAR | Status: DC | PRN
  Administered 2020-02-22: 4 mg via INTRAVENOUS

## 2020-02-22 MED ORDER — OXYCODONE HCL 5 MG PO TABS
5.0000 mg | ORAL_TABLET | Freq: Four times a day (QID) | ORAL | 0 refills | Status: DC | PRN
Start: 2020-02-22 — End: 2021-11-24

## 2020-02-22 MED ORDER — BUPIVACAINE HCL (PF) 0.25 % IJ SOLN
INTRAMUSCULAR | Status: DC | PRN
  Administered 2020-02-22: 17 mL

## 2020-02-22 SURGICAL SUPPLY — 49 items
ADH SKN CLS APL DERMABOND .7 (GAUZE/BANDAGES/DRESSINGS) ×2
APL PRP STRL LF DISP 70% ISPRP (MISCELLANEOUS) ×2
APL SKNCLS STERI-STRIP NONHPOA (GAUZE/BANDAGES/DRESSINGS)
BENZOIN TINCTURE PRP APPL 2/3 (GAUZE/BANDAGES/DRESSINGS) IMPLANT
BLADE CLIPPER SURG (BLADE) ×2 IMPLANT
BLADE SURG 15 STRL LF DISP TIS (BLADE) ×2 IMPLANT
BLADE SURG 15 STRL SS (BLADE) ×4
CHLORAPREP W/TINT 26 (MISCELLANEOUS) ×4 IMPLANT
CLOSURE WOUND 1/2 X4 (GAUZE/BANDAGES/DRESSINGS)
COVER BACK TABLE 60X90IN (DRAPES) ×4 IMPLANT
COVER MAYO STAND STRL (DRAPES) ×4 IMPLANT
COVER WAND RF STERILE (DRAPES) IMPLANT
DECANTER SPIKE VIAL GLASS SM (MISCELLANEOUS) IMPLANT
DERMABOND ADVANCED (GAUZE/BANDAGES/DRESSINGS) ×2
DERMABOND ADVANCED .7 DNX12 (GAUZE/BANDAGES/DRESSINGS) ×2 IMPLANT
DRAIN PENROSE 1/2X12 LTX STRL (WOUND CARE) ×4 IMPLANT
DRAPE LAPAROTOMY 100X72 PEDS (DRAPES) ×4 IMPLANT
DRAPE LAPAROTOMY TRNSV 102X78 (DRAPES) ×4 IMPLANT
DRAPE UTILITY XL STRL (DRAPES) ×4 IMPLANT
DRSG TEGADERM 4X4.75 (GAUZE/BANDAGES/DRESSINGS) IMPLANT
ELECT COATED BLADE 2.86 ST (ELECTRODE) ×4 IMPLANT
ELECT REM PT RETURN 9FT ADLT (ELECTROSURGICAL) ×4
ELECTRODE REM PT RTRN 9FT ADLT (ELECTROSURGICAL) ×2 IMPLANT
GLOVE BIO SURGEON STRL SZ7 (GLOVE) ×6 IMPLANT
GLOVE BIOGEL PI IND STRL 7.5 (GLOVE) ×2 IMPLANT
GLOVE BIOGEL PI INDICATOR 7.5 (GLOVE) ×4
GOWN STRL REUS W/ TWL LRG LVL3 (GOWN DISPOSABLE) ×4 IMPLANT
GOWN STRL REUS W/TWL LRG LVL3 (GOWN DISPOSABLE) ×8
MESH HERNIA SYS ULTRAPRO LRG (Mesh General) ×2 IMPLANT
NEEDLE HYPO 22GX1.5 SAFETY (NEEDLE) ×4 IMPLANT
NS IRRIG 1000ML POUR BTL (IV SOLUTION) ×2 IMPLANT
PACK BASIN DAY SURGERY FS (CUSTOM PROCEDURE TRAY) ×4 IMPLANT
PENCIL SMOKE EVACUATOR (MISCELLANEOUS) ×4 IMPLANT
SLEEVE SCD COMPRESS KNEE MED (MISCELLANEOUS) ×2 IMPLANT
SPONGE LAP 4X18 RFD (DISPOSABLE) ×4 IMPLANT
STAPLER VISISTAT 35W (STAPLE) ×2 IMPLANT
STRIP CLOSURE SKIN 1/2X4 (GAUZE/BANDAGES/DRESSINGS) IMPLANT
SUT ETHIBOND 0 MO6 C/R (SUTURE) ×2 IMPLANT
SUT MNCRL AB 4-0 PS2 18 (SUTURE) ×4 IMPLANT
SUT SILK 2 0 SH (SUTURE) IMPLANT
SUT VIC AB 0 SH 27 (SUTURE) IMPLANT
SUT VIC AB 2-0 SH 18 (SUTURE) ×6 IMPLANT
SUT VIC AB 2-0 SH 27 (SUTURE) ×8
SUT VIC AB 2-0 SH 27XBRD (SUTURE) IMPLANT
SUT VIC AB 3-0 SH 27 (SUTURE) ×4
SUT VIC AB 3-0 SH 27X BRD (SUTURE) ×2 IMPLANT
SUT VICRYL AB 3 0 TIES (SUTURE) IMPLANT
SYR CONTROL 10ML LL (SYRINGE) ×4 IMPLANT
TOWEL GREEN STERILE FF (TOWEL DISPOSABLE) ×4 IMPLANT

## 2020-02-22 NOTE — Discharge Instructions (Signed)
[facility]  UM[facility]ILICAL [facility] INGU[facility]AL [facility]ERN[facility] REPAIR: POST OP INSTRUCTIONS  Always review your discharge instruction sheet given to you by the facility where your surgery was performed. IF YOU HAVE DISABILITY OR FAMILY LEAVE FORMS, YOU MUST BRING THEM TO THE OFFICE FOR PROCESSING.   DO NOT GIVE THEM TO YOUR DOCTOR.  1. A  prescription for pain medication may be given to you upon discharge.  Take your pain medication as prescribed, if needed.  If narcotic pain medicine is not needed, then you may take acetaminophen (Tylenol), naprosyn (Alleve) or ibuprofen (Advil) as needed. 2. Take your usually prescribed medications unless otherwise directed. 3. If you need a refill on your pain medication, please contact your pharmacy.  They will contact our office to request authorization. Prescriptions will not be filled after 5 pm or on week-ends.    Post Anesthesia Home Care Instructions  Activity: Get plenty of rest for the remainder of the day. A responsible individual must stay with you for 24 hours following the procedure.  For the next 24 hours, DO NOT: -Drive a car -Advertising copywriter -Drink alcoholic beverages -Take any medication unless instructed by your physician -Make any legal decisions or sign important papers.  Meals: Start with liquid foods such as gelatin or soup. Progress to regular foods as tolerated. Avoid greasy, spicy, heavy foods. If nausea and/or vomiting occur, drink only clear liquids until the nausea and/or vomiting subsides. Call your physician if vomiting continues.  Special Instructions/Symptoms: Your throat may feel dry or sore from the anesthesia or the breathing tube placed in your throat during surgery. If this causes discomfort, gargle with warm salt water. The discomfort should disappear within 24 hours.  If you had a scopolamine patch placed behind your ear for the management of post- operative nausea and/or vomiting:  1. The medication in  the patch is effective for 72 hours, after which it should be removed.  Wrap patch in a tissue and discard in the trash. Wash hands thoroughly with soap and water. 2. You may remove the patch earlier than 72 hours if you experience unpleasant side effects which may include dry mouth, dizziness or visual disturbances. 3. Avoid touching the patch. Wash your hands with soap and water after contact with the patch.     4. You should follow a light diet the first 24 hours after arrival home, such as soup and crackers, etc.  Be sure to include lots of fluids daily.  Resume your normal diet the day after surgery. 5. Most patients will experience some swelling and bruising around the umbilicus or in the groin and scrotum.  Ice packs and reclining will help.  Swelling and bruising can take several days to resolve.  6. It is common to experience some constipation if taking pain medication after surgery.  Increasing fluid intake and taking a stool softener (such as Colace) will usually help or prevent this problem from occurring.  A mild laxative (Milk of Magnesia or Miralax) should be taken according to package directions if there are no bowel movements after 48 hours. 7. Unless discharge instructions indicate otherwise, you may remove your bandages 48 hours after surgery, and you may shower at that time.  You may have steri-strips (small skin tapes) in place directly over the incision.  These strips should be left on the skin for 7-10 days and will come off on their own.  If your surgeon used skin glue on the incision, you may shower in 24  hours.  The glue will flake off over the next 2-3 weeks.  Any sutures or staples will be removed at the office during your follow-up visit. 8. ACTIVITIES:  You may resume regular (light) daily activities beginning the next day--such as daily self-care, walking, climbing stairs--gradually increasing activities as tolerated.  You may have sexual intercourse when it is comfortable.   Refrain from any heavy lifting or straining until approved by your doctor. a. You may drive when you are no longer taking prescription pain medication, you can comfortably wear a seatbelt, and you can safely maneuver your car and apply brakes. b. RETURN TO WORK:  __________________________________________________________ 9. You should see your doctor in the office for a follow-up appointment approximately 2-3 weeks after your surgery.  Make sure that you call for this appointment within a day or two after you arrive home to insure a convenient appointment time. 10. OTHER INSTRUCTIONS:  __________________________________________________________________________________________________________________________________________________________________________________________  WHEN TO CALL YOUR DOCTOR: 1. Fever over 101.0 2. Inability to urinate 3. Nausea and/or vomiting 4. Extreme swelling or bruising 5. Continued bleeding from incision. 6. Increased pain, redness, or drainage from the incision  The clinic staff is available to answer your questions during regular business hours.  Please don't hesitate to call and ask to speak to one of the nurses for clinical concerns.  If you have a medical emergency, go to the nearest emergency room or call 911.  A surgeon from [facility] is always on call at the hospital   [address], [place], [place]  [address] ?  [address] [address], [place], [place]   [address] [phone] ? [phone] ? FAX [phone] Web site: [email or web address]  Regional Anesthesia Blocks  1. Numbness or the inability to move the "blocked" extremity may last from 3-48 hours after placement. The length of time depends on the medication injected and your individual response to the medication. If the numbness is not going away after 48 hours, call your surgeon.  2. The extremity that is blocked will need to be protected until the numbness is gone  and the  Strength has returned. Because you cannot feel it, you will need to take extra care to avoid injury. Because it may be weak, you may have difficulty moving it or using it. You may not know what position it is in without looking at it while the block is in effect.  3. For blocks in the legs and feet, returning to weight bearing and walking needs to be done carefully. You will need to wait until the numbness is entirely gone and the strength has returned. You should be able to move your leg and foot normally before you try and bear weight or walk. You will need someone to be with you when you first try to ensure you do not fall and possibly risk injury.  4. Bruising and tenderness at the needle site are common side effects and will resolve in a few days.  5. Persistent numbness or new problems with movement should be communicated to the surgeon or the [facility] [phone] [facility] [phone]).  No tylenol until after 6pm if needed today.  No ibuprofen until after 8pm if needed today.

## 2020-02-22 NOTE — Anesthesia Procedure Notes (Addendum)
Anesthesia Regional Block: TAP block   Pre-Anesthetic Checklist: ,, timeout performed, Correct Patient, Correct Site, Correct Laterality, Correct Procedure, Correct Position, site marked, Risks and benefits discussed,  Surgical consent,  Pre-op evaluation,  At surgeon's request and post-op pain management  Laterality: Left  Prep: Maximum Sterile Barrier Precautions used, Dura Prep       Needles:   Needle Type: Echogenic Needle     Needle Length: 9cm  Needle Gauge: 21     Additional Needles:   Procedures:,,,, ultrasound used (permanent image in chart),,,,  Narrative:  Start time: 02/22/2020 1:25 PM End time: 02/22/2020 1:34 PM

## 2020-02-22 NOTE — Interval H&P Note (Signed)
History and Physical Interval Note:  02/22/2020 1:05 PM  Patrick Webster.  has presented today for surgery, with the diagnosis of umbilical hernia inguinal hernia.  The various methods of treatment have been discussed with the patient and family. After consideration of risks, benefits and other options for treatment, the patient has consented to  Procedure(s) with comments: open umbilical hernia repair (N/A) - gen and lma left inguinal hernia repair (Left) - gen and lma as a surgical intervention.  The patient's history has been reviewed, patient examined, no change in status, stable for surgery.  I have reviewed the patient's chart and labs.  Questions were answered to the patient's satisfaction.     Emelia Loron

## 2020-02-22 NOTE — H&P (Signed)
Patrick Lenzo. is an 64 y.o. male.   Chief Complaint: hernias HPI: 68 yom who I saw several years ago for asymptomatic left groin hernia and an umbilical hernia that I found. he elected to follow these. the umbilical hernia has not changed. It is always out but has no symptoms. the left groin hernia has increased in size and is mostly out. he now has some discomfort. they also know someone who someone who had urgent operation for incarceration. 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 he has had a thyroidectomy since st visit but no other real chances. he is here prior to surgery. he has had no changes since I last saw him   Past Medical History:  Diagnosis Date  . Complication of anesthesia   . Depression   . Family history of adverse reaction to anesthesia    mother gets N/V  . Hyperthyroidism    S/P ablation  . Pneumonia   . PONV (postoperative nausea and vomiting)     Past Surgical History:  Procedure Laterality Date  . COLONOSCOPY    . EYE SURGERY     detached retina  . RETINAL DETACHMENT SURGERY     OD  . THYROIDECTOMY Right 02/28/2018   Procedure: RIGHT THYROIDECTOMY with frozen section;  Surgeon: Serena Colonel, MD;  Location: [facility];  Service: ENT;  Laterality: Right;  . TONSILLECTOMY AND ADENOIDECTOMY    . WISDOM TOOTH EXTRACTION      Family History  Problem Relation Age of Onset  . Cancer Maternal Grandfather        colon  . Aortic aneurysm Father        also thoracic   . Deep vein thrombosis Father        thigh X 2  . Cancer Maternal Uncle        1 with colon & 1 hepatic  . Aneurysm Paternal Uncle        cns  . Deep vein thrombosis Paternal Uncle        calf   Social History:  reports that he has never smoked. He has never used smokeless tobacco. He reports that he does not drink alcohol and does not use drugs.  Allergies:  Allergies  Allergen Reactions  . Penicillins Hives    Has patient had a PCN reaction causing immediate rash,  facial/tongue/throat swelling, SOB or lightheadedness with hypotension: No Has patient had a PCN reaction causing severe rash involving mucus membranes or skin necrosis: No Has patient had a PCN reaction that required hospitalization: No Has patient had a PCN reaction occurring within the last 10 years: No If all of the above answers are "NO", then may proceed with Cephalosporin use.     Medications Prior to Admission  Medication Sig Dispense Refill  . aspirin 81 MG tablet Take 160 mg by mouth daily.    . B Complex-C (SUPER B COMPLEX PO) Take 1 capsule by mouth daily.     Marland Kitchen buPROPion (WELLBUTRIN SR) 200 MG 12 hr tablet Take 400 mg by mouth daily.   1  . Calcium Citrate-Vitamin D (CITRACAL/VITAMIN D PO) Take 1 tablet by mouth daily.     . Coenzyme Q10 (COQ-10) 100 MG CAPS Take 2 capsules by mouth daily.     . eszopiclone (LUNESTA) 2 MG TABS tablet Take 2 mg by mouth at bedtime as needed for sleep.    . Glucosamine-Chondroit-Vit C-Mn (GLUCOSAMINE 1500 COMPLEX PO) Take 2 tablets by  mouth daily.     Marland Kitchen ibuprofen (ADVIL,MOTRIN) 200 MG tablet Take 200 mg by mouth every 6 (six) hours as needed for moderate pain.    Marland Kitchen levothyroxine (SYNTHROID, LEVOTHROID) 75 MCG tablet Take 75 mcg by mouth daily.  12  . lithium carbonate 300 MG capsule Take 600 mg by mouth at bedtime.     . Omega-3 Fatty Acids (FISH OIL) 1200 MG CAPS Take by mouth. 2 by mouth daily    . Saw Palmetto, Serenoa repens, 450 MG CAPS Take by mouth. 2 by mouth daily    . vitamin C (ASCORBIC ACID) 500 MG tablet Take 500 mg by mouth daily.      No results found for this or any previous visit (from the past 48 hour(s)). No results found.  Review of Systems  All other systems reviewed and are negative.  Height 5\' 10"  (1.778 m), weight 83.9 kg. Physical Exam  General Mental Status-Alert. Orientation-Oriented X3. Abdomen Note: nontender nonreducible umbilical hernia less than 1 cm large reducible lih, nontender no  rih  Assessment/Plan INGUINAL HERNIA, LEFT (K40.90) Story: Open left inguinal hernia repair and open [facility] repair We discussed observation versus repair. We discussed both laparoscopic and open inguinal hernia repairs. I described the procedure in detail. Goals should be achieved with surgery. We discussed the usage of mesh and the rationale behind that. We went over the pathophysiology of inguinal hernia. We have elected to perform open inguinal hernia repair with mesh. We discussed the risks including bleeding, infection, recurrence, postoperative pain and chronic groin pain, testicular injury, urinary retention, numbness in groin and around incision.  , MD 02/22/2020, 1:04 PM

## 2020-02-22 NOTE — Anesthesia Postprocedure Evaluation (Signed)
Anesthesia Post Note  Patient: Patrick Webster.  Procedure(s) Performed: open umbilical hernia repair (N/A Abdomen) left inguinal hernia repair (Left Groin)     Patient location during evaluation: PACU Anesthesia Type: General Level of consciousness: awake and alert Pain management: pain level controlled Vital Signs Assessment: post-procedure vital signs reviewed and stable Respiratory status: spontaneous breathing, nonlabored ventilation, respiratory function stable and patient connected to nasal cannula oxygen Cardiovascular status: blood pressure returned to baseline and stable Postop Assessment: no apparent nausea or vomiting Anesthetic complications: no   No complications documented.  Last Vitals:  Vitals:   02/22/20 1600 02/22/20 1611  BP:  121/76  Pulse: 81 78  Resp: 15 16  Temp:  36.5 C  SpO2: 94% 99%    Last Pain:  Vitals:   02/22/20 1611  PainSc: 4                  Trevor Iha

## 2020-02-22 NOTE — Anesthesia Procedure Notes (Signed)
Procedure Name: LMA Insertion Date/Time: 02/22/2020 1:24 PM Performed by: Ronnette Hila, CRNA Pre-anesthesia Checklist: Patient identified, Emergency Drugs available, Suction available and Patient being monitored Patient Re-evaluated:Patient Re-evaluated prior to induction Oxygen Delivery Method: Circle system utilized Preoxygenation: Pre-oxygenation with 100% oxygen Induction Type: IV induction Ventilation: Mask ventilation without difficulty LMA: LMA inserted LMA Size: 4.0 Number of attempts: 1 Airway Equipment and Method: Bite block Placement Confirmation: positive ETCO2 Tube secured with: Tape Dental Injury: Teeth and Oropharynx as per pre-operative assessment

## 2020-02-22 NOTE — Op Note (Signed)
Preoperative diagnosis: 1.  Umbilical hernia 2.  Left inguinal hernia Postoperative diagnosis: 1.  Umbilical hernia 2.  Left direct inguinal hernia Procedure: 1.  Primary umbilical hernia repair 2.  Open left inguinal hernia repair with ultra pro hernia system Surgeon: Dr. Harden Mo Anesthesia: General Estimated blood loss: 20 cc Drains: None Specimens: None Complications: None Special count was correct completion Disposition to recovery stable condition  Indications:64 yom who I saw several years ago for asymptomatic left groin hernia and an umbilical hernia that I found. he elected to follow these. the umbilical hernia has not changed. It is always out but has no symptoms. the left groin hernia has increased in size and is mostly out. he now has some discomfort. they also know someone who someone who had urgent operation for incarceration. 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 he has had a thyroidectomy since st visit but no other real chances. he is here prior to surgery. he has had no changes since I last saw him.  We discussed an open hernia repair due to the fact that this was scrotal in nature as well as a umbilical hernia pair likely primary.  Procedure: After informed consent was obtained the patient first underwent a tap block.  He was given antibiotics.  SCDs were placed.  He was placed under general anesthesia without complication.  He was prepped and draped in the standard sterile surgical fashion.  A surgical timeout was then performed.  I first repaired the umbilical hernia.  I infiltrated some Marcaine below the umbilicus.  I then made a curvilinear incision.  I used a Kelly clamp to encircle the stalk.  I then divided the stalk.  There was just some preperitoneal fat present.  This was a 5 mm hernia.  I repaired this with 0 Ethibond sutures.  This completely obliterated the defect without tension.  I then tacked the umbilicus down with a 3-0 Vicryl.   The skin was closed with a 3-0 Vicryl, 4 Monocryl, glue and Steri-Strips.  I then filtrated Marcaine in the left groin.  He had a very large hernia which somewhat reduced with anesthesia.  I then made an incision and carried this down to the external oblique.  His external oblique was splayed and was very minimal.  I did enter into it through the external ring.  He had a very large direct hernia with essentially no floor present.  I encircled the spermatic cord and separated it from the direct hernia sac.  There was no evidence of an indirect hernia.  His tissue was all very thin throughout.  I entered into the preperitoneal space and reduce the contents of the hernia.  I swept these cephalad with a Ray-Tec.  Identified the landmarks below.  I was able to identify the bony landmarks and move the hernia contents cephalad of that.  I elected due to the size of this to use an ultra pro hernia system.  I placed the bottom portion of the bilayer in the preperitoneal space and positioned this in a manner akin to a laparoscopic procedure.  This completely reduced the hernia.  I then closed the floor with 2-0 Vicryl suture to separate the mesh.  I brought the top portion of the mesh and deployed that.  I sutured this into position at the pubic tubercle several times with a 2-0 Vicryl.  I then made a T cut in the mesh and wrapped around the spermatic cord.  I  secured the mesh to the inguinal ligament every half centimeter.  I sewed the T cut together.  I laid the lateral and flat.  I sutured the top portion of the mesh to some of the internal oblique that was present but again this tissue was not very healthy.  This did completely obliterate the defect.  On Valsalva it look like of adequate repair that completely covered the entire floor.  Hemostasis was then observed.  I closed the external oblique that was left with 2-0 Vicryl suture.  The Scarpa's fascia was closed with 3-0 Vicryl the skin was closed with 4-0 Monocryl  and glue.  He tolerated this well was extubated and transferred to recovery stable.

## 2020-02-22 NOTE — Transfer of Care (Signed)
Immediate Anesthesia Transfer of Care Note  Patient: Patrick Webster.  Procedure(s) Performed: open umbilical hernia repair (N/A Abdomen) left inguinal hernia repair (Left Groin)  Patient Location: PACU  Anesthesia Type:General  Level of Consciousness: sedated  Airway & Oxygen Therapy: Patient Spontanous Breathing and Patient connected to face mask oxygen  Post-op Assessment: Report given to RN and Post -op Vital signs reviewed and stable  Post vital signs: Reviewed and stable  Last Vitals:  Vitals Value Taken Time  BP    Temp    Pulse 85 02/22/20 1451  Resp 19 02/22/20 1451  SpO2 83 % 02/22/20 1451  Vitals shown include unvalidated device data.  Last Pain: There were no vitals filed for this visit.       Complications: No complications documented.

## 2020-02-23 ENCOUNTER — Encounter (HOSPITAL_BASED_OUTPATIENT_CLINIC_OR_DEPARTMENT_OTHER): Payer: Self-pay | Admitting: General Surgery

## 2021-04-13 HISTORY — PX: REVISION TOTAL KNEE ARTHROPLASTY: SHX767

## 2021-06-06 IMAGING — US US THYROID
1 series · 13 of 25 positions shown · non-contrast
Comparison: 11/28/2016; ultrasound-guided left thyroid nodule
fine-needle aspiration-01/12/2017

CLINICAL DATA: Prior ultrasound follow-up. Postprocedural
hypothyroidism. History of radioactive iodine ablation in 4884.
left-sided thyroid nodule fine-needle aspiration performed [DATE].

EXAM:
THYROID ULTRASOUND
TECHNIQUE: Ultrasound examination of the thyroid gland and adjacent soft
tissues was performed.

[Series 1: us thyroid · 0.08mm/px · 13 of 51 slices shown]
[im 1/51]
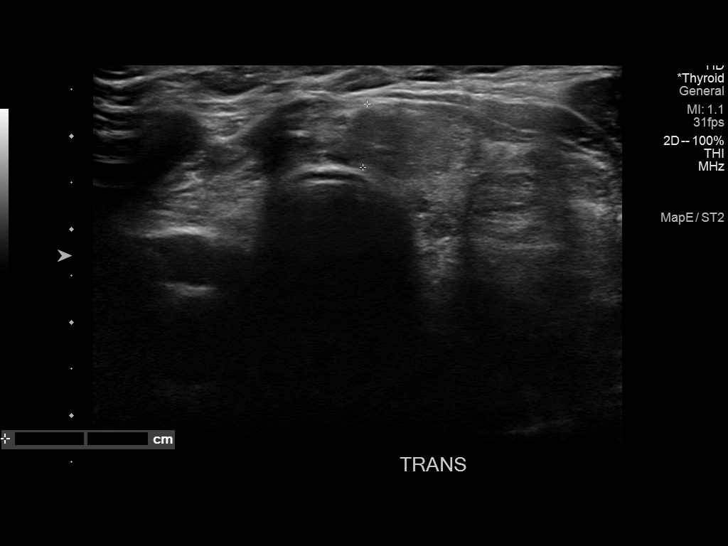
[im 5/51]
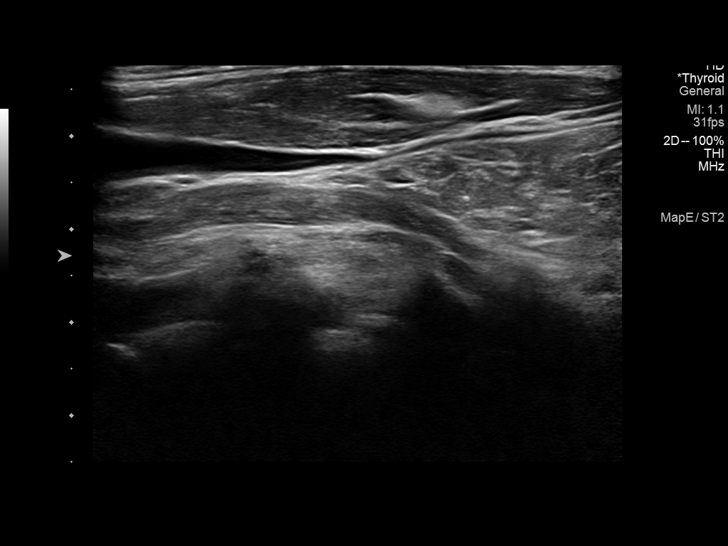
[im 9/51]
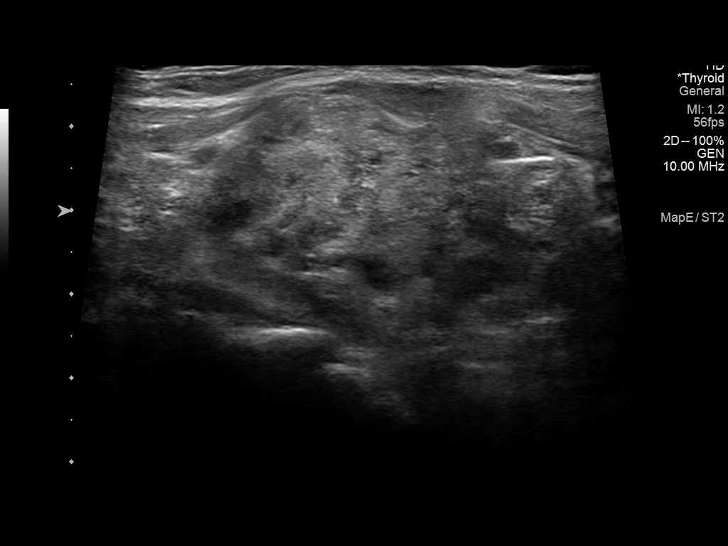
[im 13/51]
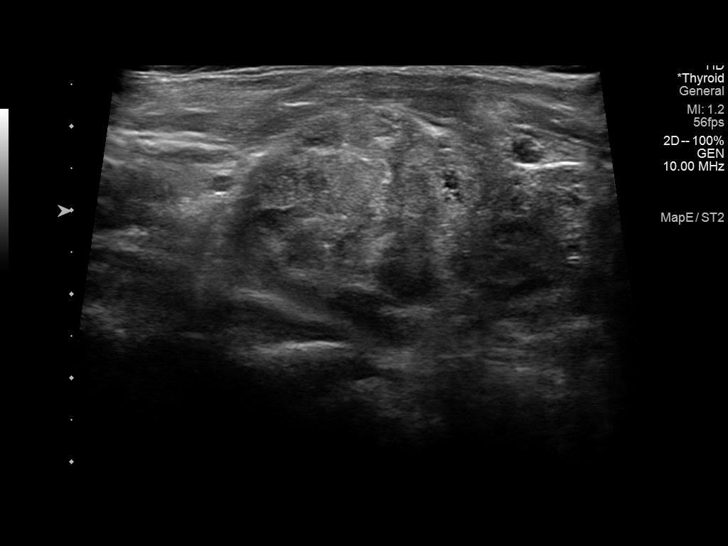
[im 17/51]
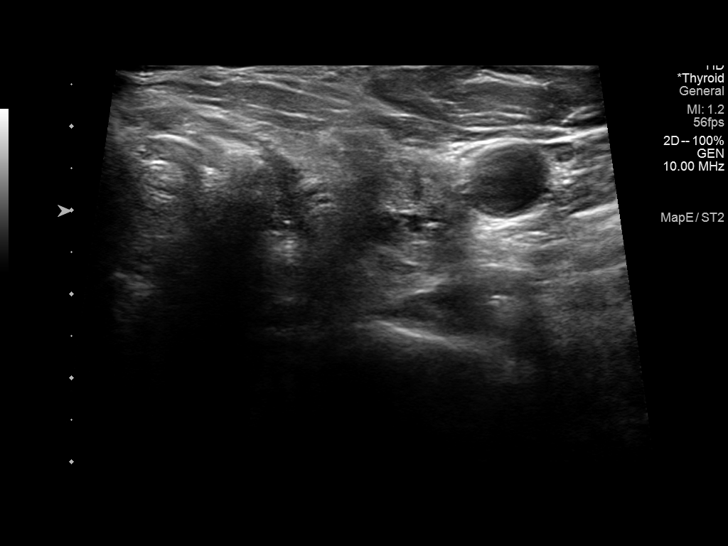
[im 21/51]
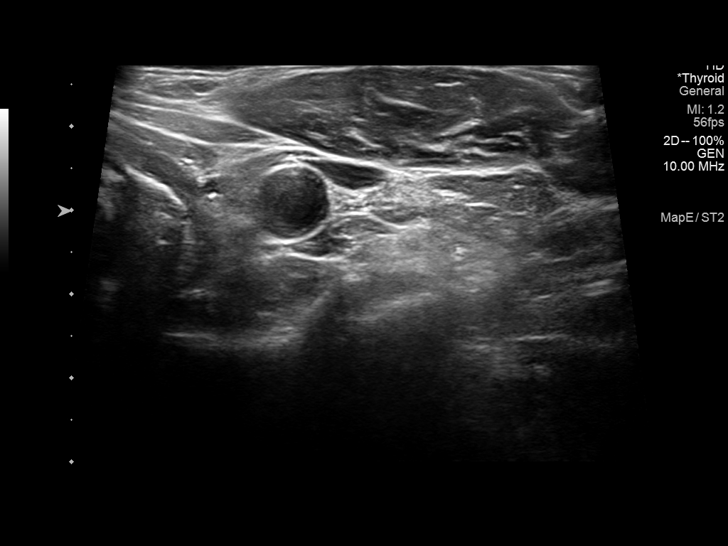
[im 26/51]
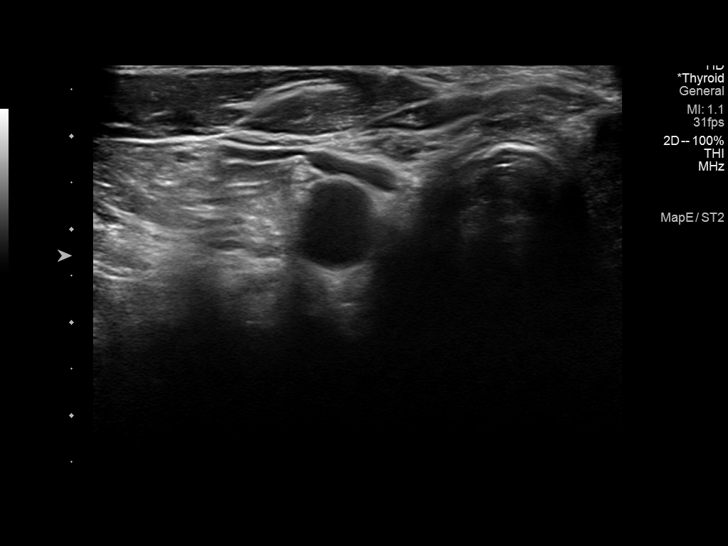
[im 30/51]
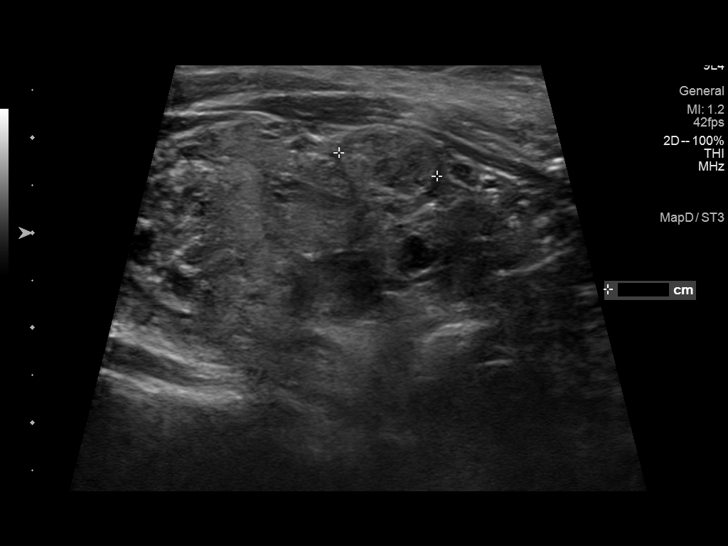
[im 34/51]
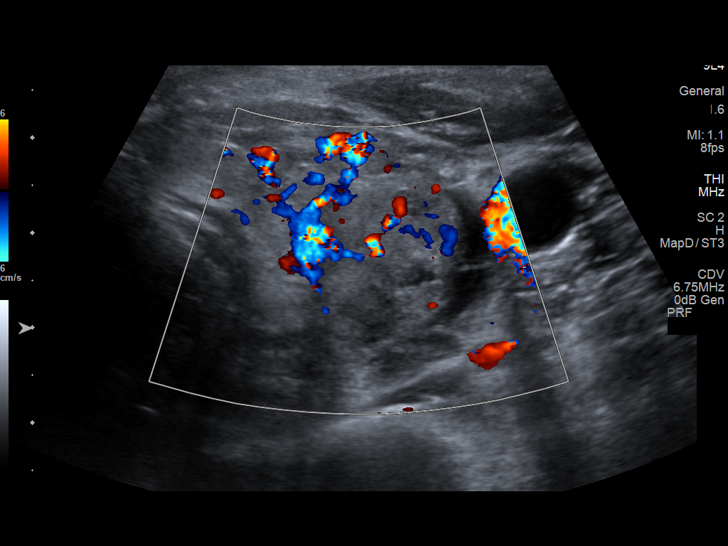
[im 38/51]
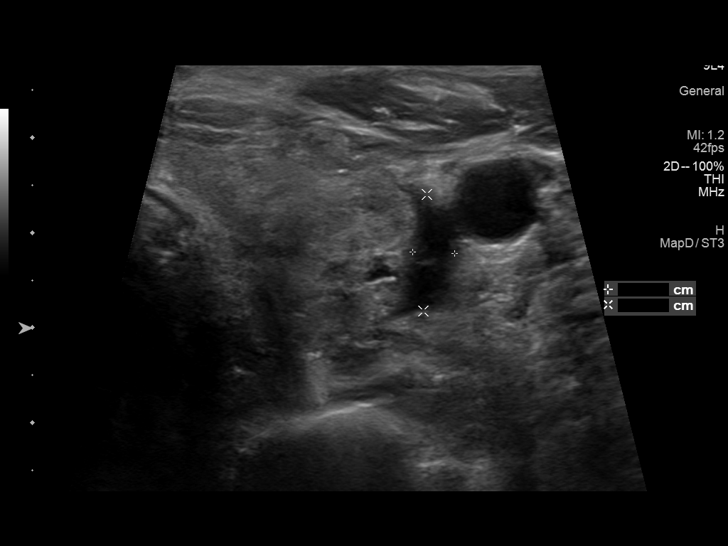
[im 42/51]
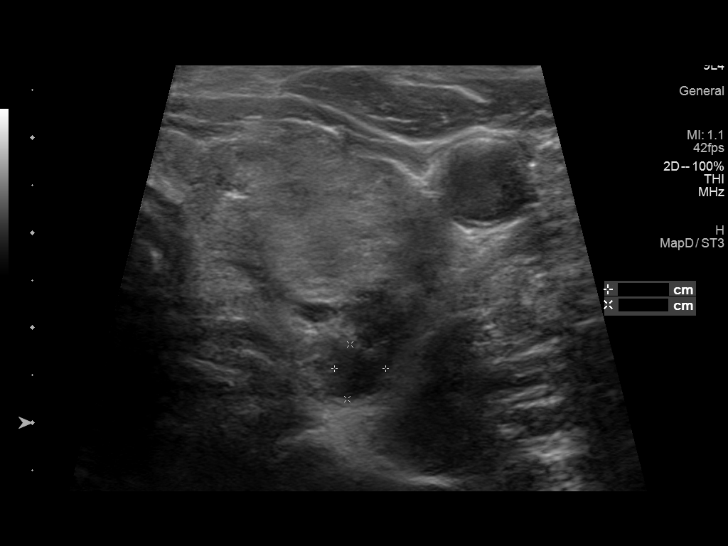
[im 46/51]
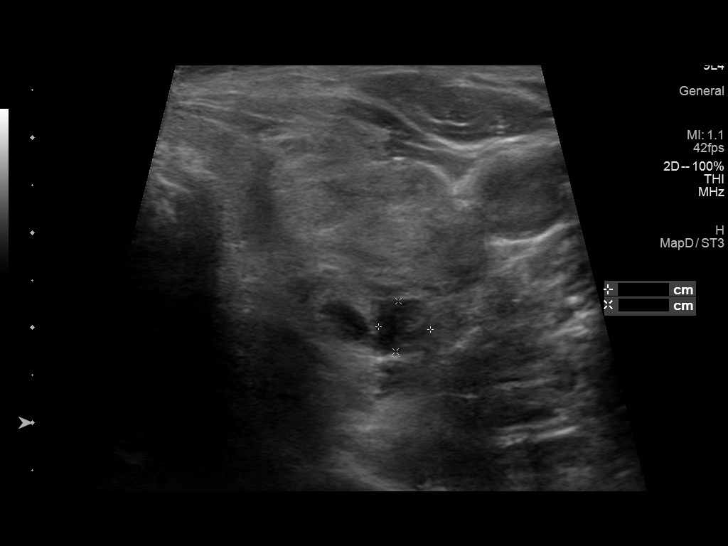
[im 51/51]
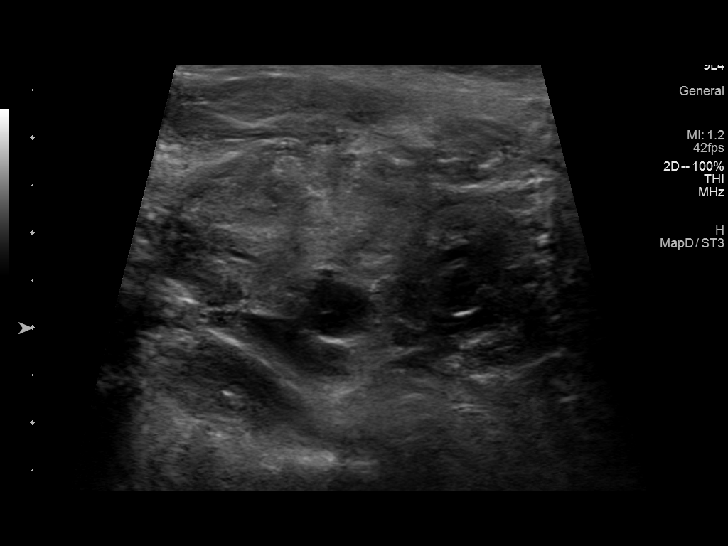

[13 of 25 positions shown; findings below may reference images not displayed]

FINDINGS: Parenchymal Echotexture: Markedly heterogenous

Isthmus: Normal in size measures 0.7 cm in diameter, unchanged

Right lobe: Surgically absent. There is no residual nodular soft
tissue within the right lobectomy resection bed.

Left lobe: Normal in size measuring 4.7 x 2.7 x 2.3 cm, previously,
5.3 x 2.7 x 1.8 cm

_________________________________________________________

Estimated total number of nodules >/= 1 cm: 4

Number of spongiform nodules >/=  2 cm not described below (TR1): 0

Number of mixed cystic and solid nodules >/= 1.5 cm not described
below (TR2): 0

_________________________________________________________

The previously biopsied 3.3 cm left-sided nodule is not seen on the
present examination and thus may have represented a pseudonodule.

_________________________________________________________

There is an approximately 1.3 x 1.2 x 0.4 cm minimally complex cyst
within the superior pole of the left lobe of the thyroid (labeled 1)
which does not meet criteria to recommend percutaneous sampling or
continued dedicated follow-up.

Questioned approximately 1.1 cm isoechoic ill-defined
nodule/pseudonodule within the mid aspect the left lobe of the
thyroid (labeled 2), which does not meet criteria to recommend
percutaneous sampling or continued dedicated follow-up

There is an approximately 1.1 x 0.6 x 0.5 cm minimally complex cyst
involving the mid, posterior aspect of the left lobe of the thyroid
(labeled 3), which does not meet criteria to recommend percutaneous
sampling or follow-up.

There is an approximately 0.7 x 0.6 x 0.5 cm minimally complex cyst
involving the mid, posterior aspect the left lobe the thyroid which
does not meet criteria to recommend percutaneous sampling or
continued dedicated follow-up

There is an approximately 1.3 x 1.0 x 0.7 cm minimally complex cyst
within the inferior pole the left lobe of the thyroid labeled 4),
which does not meet criteria to recommend percutaneous sampling or
continued dedicated follow-up.
IMPRESSION: 1. Similar sequela of right thyroid lobectomy without evidence of
residual or locally recurrent disease.
2. Previously biopsied 3.3 cm left-sided nodule is not seen on the
present examination and thus may have represented a pseudonodule.
3. None of the remaining discretely measured nodules/cysts meet
imaging criteria to recommend percutaneous sampling or continued
dedicated follow-up.

The above is in keeping with the ACR TI-RADS recommendations - [HOSPITAL] 1060;[DATE].

## 2021-11-23 ENCOUNTER — Inpatient Hospital Stay (HOSPITAL_BASED_OUTPATIENT_CLINIC_OR_DEPARTMENT_OTHER)
Admission: EM | Admit: 2021-11-23 | Discharge: 2021-11-26 | DRG: 419 | Disposition: A | Discharge status: Home / Self Care | Payer: Medicare Other | Attending: General Surgery | Admitting: General Surgery

## 2021-11-23 ENCOUNTER — Encounter (HOSPITAL_BASED_OUTPATIENT_CLINIC_OR_DEPARTMENT_OTHER): Payer: Self-pay

## 2021-11-23 ENCOUNTER — Other Ambulatory Visit: Payer: Self-pay

## 2021-11-23 ENCOUNTER — Emergency Department (HOSPITAL_BASED_OUTPATIENT_CLINIC_OR_DEPARTMENT_OTHER): Payer: Medicare Other

## 2021-11-23 DIAGNOSIS — Z79899 Other long term (current) drug therapy: Secondary | ICD-10-CM | POA: Diagnosis not present

## 2021-11-23 DIAGNOSIS — E890 Postprocedural hypothyroidism: Secondary | ICD-10-CM | POA: Diagnosis present

## 2021-11-23 DIAGNOSIS — K810 Acute cholecystitis: Principal | ICD-10-CM

## 2021-11-23 DIAGNOSIS — K8000 Calculus of gallbladder with acute cholecystitis without obstruction: Secondary | ICD-10-CM | POA: Diagnosis present

## 2021-11-23 DIAGNOSIS — K82A1 Gangrene of gallbladder in cholecystitis: Secondary | ICD-10-CM | POA: Diagnosis present

## 2021-11-23 DIAGNOSIS — Z7982 Long term (current) use of aspirin: Secondary | ICD-10-CM | POA: Diagnosis not present

## 2021-11-23 DIAGNOSIS — Z79890 Hormone replacement therapy (postmenopausal): Secondary | ICD-10-CM | POA: Diagnosis not present

## 2021-11-23 DIAGNOSIS — M1710 Unilateral primary osteoarthritis, unspecified knee: Secondary | ICD-10-CM | POA: Diagnosis present

## 2021-11-23 DIAGNOSIS — K819 Cholecystitis, unspecified: Secondary | ICD-10-CM | POA: Diagnosis present

## 2021-11-23 DIAGNOSIS — K8012 Calculus of gallbladder with acute and chronic cholecystitis without obstruction: Secondary | ICD-10-CM | POA: Diagnosis present

## 2021-11-23 DIAGNOSIS — Z880 Allergy status to penicillin: Secondary | ICD-10-CM

## 2021-11-23 DIAGNOSIS — R1011 Right upper quadrant pain: Secondary | ICD-10-CM | POA: Diagnosis present

## 2021-11-23 LAB — COMPREHENSIVE METABOLIC PANEL
ALT: 43 U/L (ref 0–44)
AST: 26 U/L (ref 15–41)
Albumin: 4.7 g/dL (ref 3.5–5.0)
Alkaline Phosphatase: 82 U/L (ref 38–126)
Anion gap: 10 (ref 5–15)
BUN: 19 mg/dL (ref 8–23)
CO2: 24 mmol/L (ref 22–32)
Calcium: 9.6 mg/dL (ref 8.9–10.3)
Chloride: 102 mmol/L (ref 98–111)
Creatinine, Ser: 1.16 mg/dL (ref 0.61–1.24)
GFR, Estimated: >60 mL/min (ref >60)
Glucose, Bld: 130 mg/dL — ABNORMAL HIGH (ref 70–99)
Potassium: 4.1 mmol/L (ref 3.5–5.1)
Sodium: 136 mmol/L (ref 135–145)
Total Bilirubin: 0.4 mg/dL (ref 0.3–1.2)
Total Protein: 7.4 g/dL (ref 6.5–8.1)

## 2021-11-23 LAB — CBC
HCT: 44.8 % (ref 39.0–52.0)
HCT: 44.2 % (ref 39.0–52.0)
Hemoglobin: 15 g/dL (ref 13.0–17.0)
Hemoglobin: 14.5 g/dL (ref 13.0–17.0)
MCH: 30.1 pg (ref 26.0–34.0)
MCH: 29.9 pg (ref 26.0–34.0)
MCHC: 33.5 g/dL (ref 30.0–36.0)
MCHC: 32.8 g/dL (ref 30.0–36.0)
MCV: 90 fL (ref 80.0–100.0)
MCV: 91.1 fL (ref 80.0–100.0)
Platelets: 195 10*3/uL (ref 150–400)
Platelets: 212 10*3/uL (ref 150–400)
RBC: 4.98 MIL/uL (ref 4.22–5.81)
RBC: 4.85 MIL/uL (ref 4.22–5.81)
RDW: 13 % (ref 11.5–15.5)
RDW: 13.2 % (ref 11.5–15.5)
WBC: 13.8 10*3/uL — ABNORMAL HIGH (ref 4.0–10.5)
WBC: 13.5 10*3/uL — ABNORMAL HIGH (ref 4.0–10.5)
nRBC: 0 % (ref 0.0–0.2)
nRBC: 0 % (ref 0.0–0.2)

## 2021-11-23 LAB — LIPASE, BLOOD: Lipase: 22 U/L (ref 11–51)

## 2021-11-23 LAB — GLUCOSE, CAPILLARY: Glucose-Capillary: 145 mg/dL — ABNORMAL HIGH (ref 70–99)

## 2021-11-23 LAB — URINALYSIS, ROUTINE W REFLEX MICROSCOPIC
Bilirubin Urine: NEGATIVE
Glucose, UA: NEGATIVE mg/dL
Hgb urine dipstick: NEGATIVE
Ketones, ur: NEGATIVE mg/dL
Leukocytes,Ua: NEGATIVE
Nitrite: NEGATIVE
Specific Gravity, Urine: >1.046 — ABNORMAL HIGH (ref 1.005–1.030)
pH: 6.5 (ref 5.0–8.0)

## 2021-11-23 LAB — CREATININE, SERUM
Creatinine, Ser: 1.08 mg/dL (ref 0.61–1.24)
GFR, Estimated: >60 mL/min (ref >60)

## 2021-11-23 LAB — HIV ANTIBODY (ROUTINE TESTING W REFLEX): HIV Screen 4th Generation wRfx: NONREACTIVE

## 2021-11-23 LAB — LACTIC ACID, PLASMA: Lactic Acid, Venous: 1.6 mmol/L (ref 0.5–1.9)

## 2021-11-23 MED ORDER — POTASSIUM CHLORIDE IN NACL 20-0.9 MEQ/L-% IV SOLN
INTRAVENOUS | Status: DC
  Filled 2021-11-23 (×6): qty 1000

## 2021-11-23 MED ORDER — SODIUM CHLORIDE 0.9 % IV SOLN: 2.0000 g | INTRAVENOUS | Status: DC

## 2021-11-23 MED ORDER — DIPHENHYDRAMINE HCL 50 MG/ML IJ SOLN
12.5000 mg | Freq: Four times a day (QID) | INTRAMUSCULAR | Status: DC | PRN
  Administered 2021-11-23: 12.5 mg via INTRAVENOUS
  Filled 2021-11-23: qty 1

## 2021-11-23 MED ORDER — HYDROMORPHONE HCL 1 MG/ML IJ SOLN
1.0000 mg | INTRAMUSCULAR | Status: DC | PRN
  Administered 2021-11-23 – 2021-11-25 (×7): 1 mg via INTRAVENOUS
  Filled 2021-11-23 (×7): qty 1

## 2021-11-23 MED ORDER — ONDANSETRON HCL 4 MG/2ML IJ SOLN
4.0000 mg | Freq: Four times a day (QID) | INTRAMUSCULAR | Status: DC | PRN
  Administered 2021-11-23: 4 mg via INTRAVENOUS
  Filled 2021-11-23: qty 2

## 2021-11-23 MED ORDER — SODIUM CHLORIDE 0.9 % IV SOLN
2.0000 g | INTRAVENOUS | Status: DC
  Administered 2021-11-25: 2 g via INTRAVENOUS
  Filled 2021-11-23: qty 20

## 2021-11-23 MED ORDER — ONDANSETRON 4 MG PO TBDP: 4.0000 mg | ORAL_TABLET | Freq: Four times a day (QID) | ORAL | Status: DC | PRN

## 2021-11-23 MED ORDER — ENOXAPARIN SODIUM 40 MG/0.4ML IJ SOSY
40.0000 mg | PREFILLED_SYRINGE | Freq: Every day | INTRAMUSCULAR | Status: DC
  Administered 2021-11-23 – 2021-11-25 (×3): 40 mg via SUBCUTANEOUS
  Filled 2021-11-23 (×3): qty 0.4

## 2021-11-23 MED ORDER — MORPHINE SULFATE (PF) 4 MG/ML IV SOLN
4.0000 mg | INTRAVENOUS | Status: DC | PRN
  Administered 2021-11-23: 4 mg via INTRAVENOUS
  Filled 2021-11-23: qty 1

## 2021-11-23 MED ORDER — HYDROMORPHONE HCL 1 MG/ML IJ SOLN
1.0000 mg | Freq: Once | INTRAMUSCULAR | Status: AC
  Administered 2021-11-23: 1 mg via INTRAVENOUS
  Filled 2021-11-23: qty 1

## 2021-11-23 MED ORDER — BUPROPION HCL ER (SR) 150 MG PO TB12
400.0000 mg | ORAL_TABLET | Freq: Every day | ORAL | Status: DC
  Administered 2021-11-24 – 2021-11-26 (×3): 400 mg via ORAL
  Filled 2021-11-23 (×3): qty 1

## 2021-11-23 MED ORDER — SODIUM CHLORIDE 0.9 % IV BOLUS
500.0000 mL | Freq: Once | INTRAVENOUS | Status: AC
  Administered 2021-11-23: 500 mL via INTRAVENOUS

## 2021-11-23 MED ORDER — LIP MEDEX EX OINT
TOPICAL_OINTMENT | CUTANEOUS | Status: DC | PRN
  Administered 2021-11-24: 75 via TOPICAL
  Filled 2021-11-23: qty 7

## 2021-11-23 MED ORDER — DIPHENHYDRAMINE HCL 12.5 MG/5ML PO ELIX: 12.5000 mg | ORAL_SOLUTION | Freq: Four times a day (QID) | ORAL | Status: DC | PRN

## 2021-11-23 MED ORDER — ONDANSETRON HCL 4 MG/2ML IJ SOLN
4.0000 mg | Freq: Once | INTRAMUSCULAR | Status: AC
  Administered 2021-11-23: 4 mg via INTRAVENOUS
  Filled 2021-11-23: qty 2

## 2021-11-23 MED ORDER — SODIUM CHLORIDE 0.9 % IV SOLN
12.5000 mg | INTRAVENOUS | Status: DC | PRN
  Administered 2021-11-23 – 2021-11-24 (×5): 12.5 mg via INTRAVENOUS
  Filled 2021-11-23 (×4): qty 12.5
  Filled 2021-11-23: qty 0.5
  Filled 2021-11-23: qty 12.5

## 2021-11-23 MED ORDER — SODIUM CHLORIDE 0.9 % IV SOLN: INTRAVENOUS | Status: DC | PRN

## 2021-11-23 MED ORDER — HYDROMORPHONE BOLUS VIA INFUSION: 1.0000 mg | INTRAVENOUS | Status: DC | PRN

## 2021-11-23 MED ORDER — ACETAMINOPHEN 650 MG RE SUPP: 650.0000 mg | Freq: Four times a day (QID) | RECTAL | Status: DC | PRN

## 2021-11-23 MED ORDER — METOCLOPRAMIDE HCL 5 MG/ML IJ SOLN
10.0000 mg | Freq: Four times a day (QID) | INTRAMUSCULAR | Status: DC
  Administered 2021-11-23 – 2021-11-26 (×11): 10 mg via INTRAVENOUS
  Filled 2021-11-23 (×11): qty 2

## 2021-11-23 MED ORDER — ONDANSETRON HCL 4 MG/2ML IJ SOLN
4.0000 mg | INTRAMUSCULAR | Status: DC | PRN
  Administered 2021-11-23 – 2021-11-24 (×4): 4 mg via INTRAVENOUS
  Filled 2021-11-23 (×4): qty 2

## 2021-11-23 MED ORDER — IOHEXOL 300 MG/ML  SOLN
100.0000 mL | Freq: Once | INTRAMUSCULAR | Status: AC | PRN
  Administered 2021-11-23: 100 mL via INTRAVENOUS

## 2021-11-23 MED ORDER — LEVOTHYROXINE SODIUM 100 MCG PO TABS
100.0000 ug | ORAL_TABLET | Freq: Every day | ORAL | Status: DC
  Administered 2021-11-24 – 2021-11-26 (×3): 100 ug via ORAL
  Filled 2021-11-23 (×3): qty 1

## 2021-11-23 MED ORDER — LITHIUM CARBONATE 300 MG PO CAPS
600.0000 mg | ORAL_CAPSULE | Freq: Every day | ORAL | Status: DC
  Administered 2021-11-23 – 2021-11-25 (×3): 600 mg via ORAL
  Filled 2021-11-23 (×3): qty 2

## 2021-11-23 MED ORDER — SODIUM CHLORIDE 0.9 % IV SOLN
2.0000 g | Freq: Once | INTRAVENOUS | Status: AC
  Administered 2021-11-23: 2 g via INTRAVENOUS
  Filled 2021-11-23: qty 20

## 2021-11-23 MED ORDER — HYDROMORPHONE HCL 1 MG/ML IJ SOLN: 1.0000 mg | INTRAMUSCULAR | Status: DC | PRN

## 2021-11-23 MED ORDER — ACETAMINOPHEN 325 MG PO TABS: 650.0000 mg | ORAL_TABLET | Freq: Four times a day (QID) | ORAL | Status: DC | PRN

## 2021-11-23 NOTE — H&P (Addendum)
Patrick Webster. is an 66 y.o. male.   Chief Complaint: RUQ abdominal pain HPI: This is a 66 year old male s/p umbilical and left inguinal hernia repair with mesh who presents with several days of worsening RUQ abdominal pain, associated with nausea, vomiting, and diarrhea.  His symptoms worsened last night, so he presented to the ED for evaluation.  WBC was elevated, LFT's WNL, CT scan revealed acute cholecystitis.  Incidental finding of soft tissue mass near right kidney that requires close follow-up.  He is being admitted for management.  He had recent retina surgery for retinal detachment and is scheduled for upcoming total knee replacement by Dr. Lequita Halt in the next few weeks.  Past Medical History:  Diagnosis Date   Complication of anesthesia    Depression    Family history of adverse reaction to anesthesia    mother gets N/V   Hyperthyroidism    S/P ablation   Pneumonia    PONV (postoperative nausea and vomiting)     Past Surgical History:  Procedure Laterality Date   COLONOSCOPY     EYE SURGERY     detached retina   INGUINAL HERNIA REPAIR Left 02/22/2020   Procedure: left inguinal hernia repair;  Surgeon: Emelia Loron, MD;  Location: [facility];  Service: General;  Laterality: Left;  gen and lma   RETINAL DETACHMENT SURGERY     OD   THYROIDECTOMY Right 02/28/2018   Procedure: RIGHT THYROIDECTOMY with frozen section;  Surgeon: Serena Colonel, MD;  Location: [facility] OR;  Service: ENT;  Laterality: Right;   TONSILLECTOMY AND ADENOIDECTOMY     UMBILICAL HERNIA REPAIR N/A 02/22/2020   Procedure: open umbilical hernia repair;  Surgeon: Emelia Loron, MD;  Location:  SURGERY CENTER;  Service: General;  Laterality: N/A;  gen and lma   WISDOM TOOTH EXTRACTION      Family History  Problem Relation Age of Onset   Cancer Maternal Grandfather        colon   Aortic aneurysm Father        also thoracic    Deep vein thrombosis Father        thigh X 2    Cancer Maternal Uncle        1 with colon & 1 hepatic   Aneurysm Paternal Uncle        cns   Deep vein thrombosis Paternal Uncle        calf   Social History:  reports that he has never smoked. He has never used smokeless tobacco. He reports that he does not drink alcohol and does not use drugs.  Allergies:  Allergies  Allergen Reactions   Penicillins Hives    Has patient had a PCN reaction causing immediate rash, facial/tongue/throat swelling, SOB or lightheadedness with hypotension: No Has patient had a PCN reaction causing severe rash involving mucus membranes or skin necrosis: No Has patient had a PCN reaction that required hospitalization: No Has patient had a PCN reaction occurring within the last 10 years: No If all of the above answers are "NO", then may proceed with Cephalosporin use.    Prior to Admission medications   Medication Sig Start Date End Date Taking? Authorizing Provider  aspirin 81 MG tablet Take 160 mg by mouth daily.    [provider]  B Complex-C (SUPER B COMPLEX PO) Take 1 capsule by mouth daily.     [provider]  buPROPion (WELLBUTRIN SR) 200 MG 12 hr tablet Take  400 mg by mouth daily.  01/22/18   [provider]  Calcium Citrate-Vitamin D (CITRACAL/VITAMIN D PO) Take 1 tablet by mouth daily.     [provider]  Coenzyme Q10 (COQ-10) 100 MG CAPS Take 2 capsules by mouth daily.     [provider]  eszopiclone (LUNESTA) 2 MG TABS tablet Take 2 mg by mouth at bedtime as needed for sleep. 11/04/17   [provider]  Glucosamine-Chondroit-Vit C-Mn (GLUCOSAMINE 1500 COMPLEX PO) Take 2 tablets by mouth daily.     [provider]  ibuprofen (ADVIL,MOTRIN) 200 MG tablet Take 200 mg by mouth every 6 (six) hours as needed for moderate pain.    [provider]  levothyroxine (SYNTHROID, LEVOTHROID) 75 MCG tablet Take 75 mcg by mouth daily. 01/13/18   [provider]  lithium carbonate  300 MG capsule Take 600 mg by mouth at bedtime.     [provider]  Omega-3 Fatty Acids (FISH OIL) 1200 MG CAPS Take by mouth. 2 by mouth daily    [provider]  oxyCODONE (OXY IR/ROXICODONE) 5 MG immediate release tablet Take 1 tablet (5 mg total) by mouth every 6 (six) hours as needed. 02/22/20   Emelia Loron, MD  Saw Palmetto, Serenoa repens, 450 MG CAPS Take by mouth. 2 by mouth daily    [provider]  vitamin C (ASCORBIC ACID) 500 MG tablet Take 500 mg by mouth daily.    [provider]     Results for orders placed or performed during the hospital encounter of 11/23/21 (from the past 48 hour(s))  Lipase, blood     Status: None   Collection Time: 11/23/21 10:14 AM  Result Value Ref Range   Lipase 22 11 - 51 U/L    Comment: Performed at [facility], [address] [place], [place] [address]  Comprehensive metabolic panel     Status: Abnormal   Collection Time: 11/23/21 10:14 AM  Result Value Ref Range   Sodium 136 135 - 145 mmol/L   Potassium 4.1 3.5 - 5.1 mmol/L   Chloride 102 98 - 111 mmol/L   CO2 24 22 - 32 mmol/L   Glucose, Bld 130 (H) 70 - 99 mg/dL    Comment: Glucose reference range applies only to samples taken after fasting for at least 8 hours.   BUN 19 8 - 23 mg/dL   Creatinine, Ser 6.37 0.61 - 1.24 mg/dL   Calcium 9.6 8.9 - 85.8 mg/dL   Total Protein 7.4 6.5 - 8.1 g/dL   Albumin 4.7 3.5 - 5.0 g/dL   AST 26 15 - 41 U/L   ALT 43 0 - 44 U/L   Alkaline Phosphatase 82 38 - 126 U/L   Total Bilirubin 0.4 0.3 - 1.2 mg/dL   GFR, Estimated >85 >02 mL/min    Comment: (NOTE) Calculated using the CKD-EPI Creatinine Equation (2021)    Anion gap 10 5 - 15    Comment: Performed at [facility], [address], [place], [place] [address]  CBC     Status: Abnormal   Collection Time: 11/23/21 10:14 AM  Result Value Ref Range   WBC 13.8 (H) 4.0 - 10.5 K/uL   RBC 4.98 4.22 - 5.81 MIL/uL    Hemoglobin 15.0 13.0 - 17.0 g/dL   HCT 87.8 67.6 - 72.0 %   MCV 90.0 80.0 - 100.0 fL   MCH 30.1 26.0 - 34.0 pg   MCHC 33.5 30.0 - 36.0  g/dL   RDW 69.6 29.5 - 28.4 %   Platelets 195 150 - 400 K/uL   nRBC 0.0 0.0 - 0.2 %    Comment: Performed at [facility], [address] [place], [place] [address]  Lactic acid, plasma     Status: None   Collection Time: 11/23/21 11:20 AM  Result Value Ref Range   Lactic Acid, Venous 1.6 0.5 - 1.9 mmol/L    Comment: Performed at [facility], [address] [place], [place] [address]   Lipase     Component Value Date/Time   LIPASE 22 11/23/2021 1014    CT ABDOMEN PELVIS W CONTRAST  Result Date: 11/23/2021 CLINICAL DATA:  66 year old male with acute RIGHT abdominal, flank and pelvic pain. EXAM: CT ABDOMEN AND PELVIS WITH CONTRAST TECHNIQUE: Multidetector CT imaging of the abdomen and pelvis was performed using the standard protocol following bolus administration of intravenous contrast. RADIATION DOSE REDUCTION: This exam was performed according to the departmental dose-optimization program which includes automated exposure control, adjustment of the mA and/or kV according to patient size and/or use of iterative reconstruction technique. CONTRAST:  OMNIPAQUE IOHEXOL 300 MG/ML  SOLN COMPARISON:  04/14/2013 CT FINDINGS: Lower chest: Mild bibasilar atelectasis/scarring noted. Hepatobiliary: Gallbladder wall thickening and pericholecystic inflammation is noted highly suspicious for acute cholecystitis. There is no evidence of biliary dilatation. The liver is unchanged and without significant abnormality. Pancreas: Unremarkable except for small calcification in the LOWER pancreatic head which was present on 2015 and appears to be below the ampulla. Spleen: Unremarkable Adrenals/Urinary Tract: The kidneys, adrenal glands and bladder are unremarkable except for renal cysts which need no follow-up. There is a 1.5 x 1.8 cm  soft tissue mass between the RIGHT kidney and RIGHT psoas muscle (series 3: Image 42), new since 2015. Stomach/Bowel: Stomach is within normal limits. Appendix not identified. No evidence of bowel wall thickening, distention, or inflammatory changes. Vascular/Lymphatic: Aortic atherosclerosis. No enlarged abdominal or pelvic lymph nodes. Reproductive: Prostate is unremarkable. Other: No ascites, focal collection or pneumoperitoneum. Small RIGHT inguinal hernia containing fat. Evidence of LEFT inguinal hernia repair identified. Musculoskeletal: No acute or suspicious bony abnormalities are noted. IMPRESSION: 1. Gallbladder wall thickening and pericholecystic inflammation highly suspicious for acute cholecystitis. No evidence of biliary dilatation. 2. Indeterminate 1.5 x 1.8 cm soft tissue mass/lymph node between the RIGHT kidney and RIGHT psoas muscle, new since 2015. Recommend further evaluation with PET CT, sampling or very short-term follow-up. 3. Small RIGHT inguinal hernia containing fat. 4. Aortic Atherosclerosis (ICD10-I70.0). Electronically Signed   By: Harmon Pier M.D.   On: 11/23/2021 11:43    Review of Systems  HENT:  Negative for ear discharge, ear pain, hearing loss and tinnitus.   Eyes:  Negative for photophobia and pain.  Respiratory:  Negative for cough and shortness of breath.   Cardiovascular:  Negative for chest pain.  Gastrointestinal:  Positive for abdominal pain, diarrhea, nausea and vomiting.  Genitourinary:  Positive for flank pain. Negative for dysuria, frequency and urgency.  Musculoskeletal:  Positive for back pain. Negative for myalgias and neck pain.  Neurological:  Negative for dizziness and headaches.  Hematological:  Does not bruise/bleed easily.  Psychiatric/Behavioral:  The patient is not nervous/anxious.     Blood pressure (!) 150/88, pulse 80, temperature (!) 97.5 F (36.4 C), resp. rate 20, SpO2 97 %. Physical Exam  Constitutional:  WDWN in NAD, conversant, no  obvious deformities; lying in bed comfortably Eyes:  Pupils equal, round; sclera anicteric; moist  conjunctiva; no lid lag HENT:  Oral mucosa moist; good dentition  Neck:  No masses palpated, trachea midline; no thyromegaly Lungs:  CTA bilaterally; normal respiratory effort CV:  Regular rate and rhythm; no murmurs; extremities well-perfused with no edema Abd:  +bowel sounds, tender in RUQ, epigastrium and right flank Musc:  Unable to assess gait; no apparent clubbing or cyanosis in extremities Lymphatic:  No palpable cervical or axillary lymphadenopathy Skin:  Warm, dry; no sign of jaundice Psychiatric - alert and oriented x 4; calm mood and affect  Assessment/Plan Acute cholecystitis  Admit to [facility] per patient request. IV antibiotics, bowel rest Repeat labs in AM Possible laparoscopic cholecystectomy this admission.  Will reevaluate in AM.  Wynona Luna, MD 11/23/2021, 12:45 PM  Discussed at length with the patient and his wife.  His nausea and vomiting may be exacerbated by the pain medication.  Now on Reglan, Zofran, and Phenergan, as well as Dilaudid PRN for pain.  His pain seems to be fairly well-controlled, so hopefully he can minimize the Dilaudid, which might help with the nausea.    I explained that since his symptoms have been going on for almost a week and since his gallbladder appears so thickened on the CT scan, we would attempt laparoscopic cholecystectomy but there was a higher than average chance that he might need to be converted to open cholecystectomy.  We discussed the possibility of a percutaneous cholecystostomy tube as well.  The surgery team will reevaluate in the morning and will determine the timing of surgery.  Wilmon Arms. Corliss Skains, MD, [facility]  General Surgery   11/23/2021 6:53 PM

## 2021-11-23 NOTE — ED Notes (Signed)
Handoff report given to [facility] and to [employer] on 3E at [employer]

## 2021-11-23 NOTE — ED Triage Notes (Signed)
He c/o right-sided flank and abd. Pain x 6 days of varying intensity. He states it persists and worsened last night. He vomits in triage, and I medicate him for same.

## 2021-11-23 NOTE — Progress Notes (Signed)
Spoke with Tseui via phone regarding pt 's continued nausea and now dry heaving. See new orders enter by MD.

## 2021-11-23 NOTE — Progress Notes (Signed)
Dr Harlon Flor aware via phone pt in a lot of pain and nauseated even though recently received Morphine and Zofran. New orders received. I may be by to him after while.

## 2021-11-23 NOTE — Progress Notes (Signed)
AC called because wife was demanding to see someone above the nursing staff or charge. Dr Harlon Flor also notified in OR.

## 2021-11-23 NOTE — ED Provider Notes (Signed)
[facility] EMERGENCY DEPT Provider Note   CSN: [id number] Arrival date & time: 11/23/21  4627     History  Chief Complaint  Patient presents with   Abdominal Pain    Patrick Webster. is a 66 y.o. male.  The history is provided by the patient and medical records. No language interpreter was used.  Abdominal Pain Pain location:  Epigastric, R flank, RUQ and periumbilical Pain quality: aching and sharp   Pain radiates to:  R flank Pain severity:  Severe Onset quality:  Gradual Duration:  6 days Timing:  Intermittent Progression:  Waxing and waning Chronicity:  New Context: not sick contacts, not suspicious food intake and not trauma   Relieved by:  Nothing Worsened by:  Palpation Ineffective treatments:  None tried Associated symptoms: diarrhea, nausea and vomiting   Associated symptoms: no chest pain, no chills, no constipation, no cough, no dysuria, no fatigue, no fever and no shortness of breath        Home Medications Prior to Admission medications   Medication Sig Start Date End Date Taking? Authorizing Provider  aspirin 81 MG tablet Take 160 mg by mouth daily.    [provider]  B Complex-C (SUPER B COMPLEX PO) Take 1 capsule by mouth daily.     [provider]  buPROPion (WELLBUTRIN SR) 200 MG 12 hr tablet Take 400 mg by mouth daily.  01/22/18   [provider]  Calcium Citrate-Vitamin D (CITRACAL/VITAMIN D PO) Take 1 tablet by mouth daily.     [provider]  Coenzyme Q10 (COQ-10) 100 MG CAPS Take 2 capsules by mouth daily.     [provider]  eszopiclone (LUNESTA) 2 MG TABS tablet Take 2 mg by mouth at bedtime as needed for sleep. 11/04/17   [provider]  Glucosamine-Chondroit-Vit C-Mn (GLUCOSAMINE 1500 COMPLEX PO) Take 2 tablets by mouth daily.     [provider]  ibuprofen (ADVIL,MOTRIN) 200 MG tablet Take 200 mg by mouth every 6 (six) hours as needed for moderate pain.     [provider]  levothyroxine (SYNTHROID, LEVOTHROID) 75 MCG tablet Take 75 mcg by mouth daily. 01/13/18   [provider]  lithium carbonate 300 MG capsule Take 600 mg by mouth at bedtime.     [provider]  Omega-3 Fatty Acids (FISH OIL) 1200 MG CAPS Take by mouth. 2 by mouth daily    [provider]  oxyCODONE (OXY IR/ROXICODONE) 5 MG immediate release tablet Take 1 tablet (5 mg total) by mouth every 6 (six) hours as needed. 02/22/20   Emelia Loron, MD  Saw Palmetto, Serenoa repens, 450 MG CAPS Take by mouth. 2 by mouth daily    [provider]  vitamin C (ASCORBIC ACID) 500 MG tablet Take 500 mg by mouth daily.    [provider]      Allergies    Penicillins    Review of Systems   Review of Systems  Constitutional:  Negative for chills, diaphoresis, fatigue and fever.  HENT:  Negative for congestion.   Respiratory:  Negative for cough, chest tightness, shortness of breath and wheezing.   Cardiovascular:  Negative for chest pain and palpitations.  Gastrointestinal:  Positive for abdominal pain, diarrhea, nausea and vomiting. Negative for abdominal distention and constipation.  Genitourinary:  Positive for flank pain. Negative for dysuria.  Musculoskeletal:  Positive for back pain. Negative for neck pain and neck stiffness.  Skin:  Negative for rash and wound.  Neurological:  Negative for headaches.  Psychiatric/Behavioral:  Negative for agitation.   All other systems reviewed and are negative.   Physical Exam Updated Vital Signs BP 133/78 (BP Location: Right Arm)   Pulse 71   Temp (!) 97.5 F (36.4 C)   Resp 18   SpO2 100%  Physical Exam Vitals and nursing note reviewed.  Constitutional:      General: He is not in acute distress.    Appearance: He is well-developed. He is not ill-appearing, toxic-appearing or diaphoretic.  HENT:     Head: Normocephalic and atraumatic.  Eyes:     General: No scleral icterus.     Conjunctiva/sclera: Conjunctivae normal.  Cardiovascular:     Rate and Rhythm: Normal rate and regular rhythm.     Heart sounds: No murmur heard. Pulmonary:     Effort: Pulmonary effort is normal. No respiratory distress.     Breath sounds: Normal breath sounds.  Abdominal:     General: Abdomen is flat. Bowel sounds are normal.     Palpations: Abdomen is soft.     Tenderness: There is abdominal tenderness in the right upper quadrant, epigastric area and periumbilical area. There is right CVA tenderness. There is no left CVA tenderness, guarding or rebound.    Musculoskeletal:        General: No swelling.     Cervical back: Neck supple.  Skin:    General: Skin is warm and dry.     Capillary Refill: Capillary refill takes less than 2 seconds.     Coloration: Skin is not pale.     Findings: No rash.  Neurological:     Mental Status: He is alert.  Psychiatric:        Mood and Affect: Mood normal.     ED Results / Procedures / Treatments   Labs (all labs ordered are listed, but only abnormal results are displayed) Labs Reviewed  COMPREHENSIVE METABOLIC PANEL - Abnormal; Notable for the following components:      Result Value   Glucose, Bld 130 (*)    All other components within normal limits  CBC - Abnormal; Notable for the following components:   WBC 13.8 (*)    All other components within normal limits  URINALYSIS, ROUTINE W REFLEX MICROSCOPIC - Abnormal; Notable for the following components:   Specific Gravity, Urine >1.046 (*)    Protein, ur TRACE (*)    All other components within normal limits  URINE CULTURE  LIPASE, BLOOD  LACTIC ACID, PLASMA  HIV ANTIBODY (ROUTINE TESTING W REFLEX)  CBC  CREATININE, SERUM    EKG None  Radiology CT ABDOMEN PELVIS W CONTRAST  Result Date: 11/23/2021 CLINICAL DATA:  66 year old male with acute RIGHT abdominal, flank and pelvic pain. EXAM: CT ABDOMEN AND PELVIS WITH CONTRAST TECHNIQUE: Multidetector CT imaging of the abdomen  and pelvis was performed using the standard protocol following bolus administration of intravenous contrast. RADIATION DOSE REDUCTION: This exam was performed according to the departmental dose-optimization program which includes automated exposure control, adjustment of the mA and/or kV according to patient size and/or use of iterative reconstruction technique. CONTRAST:  OMNIPAQUE IOHEXOL 300 MG/ML  SOLN COMPARISON:  04/14/2013 CT FINDINGS: Lower chest: Mild bibasilar atelectasis/scarring noted. Hepatobiliary: Gallbladder wall thickening and pericholecystic inflammation is noted highly suspicious for acute cholecystitis. There is no evidence of biliary dilatation. The liver is unchanged and without significant abnormality. Pancreas: Unremarkable except for small calcification in the LOWER pancreatic head which was present on 2015  and appears to be below the ampulla. Spleen: Unremarkable Adrenals/Urinary Tract: The kidneys, adrenal glands and bladder are unremarkable except for renal cysts which need no follow-up. There is a 1.5 x 1.8 cm soft tissue mass between the RIGHT kidney and RIGHT psoas muscle (series 3: Image 42), new since 2015. Stomach/Bowel: Stomach is within normal limits. Appendix not identified. No evidence of bowel wall thickening, distention, or inflammatory changes. Vascular/Lymphatic: Aortic atherosclerosis. No enlarged abdominal or pelvic lymph nodes. Reproductive: Prostate is unremarkable. Other: No ascites, focal collection or pneumoperitoneum. Small RIGHT inguinal hernia containing fat. Evidence of LEFT inguinal hernia repair identified. Musculoskeletal: No acute or suspicious bony abnormalities are noted. IMPRESSION: 1. Gallbladder wall thickening and pericholecystic inflammation highly suspicious for acute cholecystitis. No evidence of biliary dilatation. 2. Indeterminate 1.5 x 1.8 cm soft tissue mass/lymph node between the RIGHT kidney and RIGHT psoas muscle, new since 2015.  Recommend further evaluation with PET CT, sampling or very short-term follow-up. 3. Small RIGHT inguinal hernia containing fat. 4. Aortic Atherosclerosis (ICD10-I70.0). Electronically Signed   By: Harmon Pier M.D.   On: 11/23/2021 11:43    Procedures Procedures    CRITICAL CARE Performed by: Canary Brim Karalynn Cottone Total critical care time: 35 minutes Critical care time was exclusive of separately billable procedures and treating other patients. Critical care was necessary to treat or prevent imminent or life-threatening deterioration. Critical care was time spent personally by me on the following activities: development of treatment plan with patient and/or surrogate as well as nursing, discussions with consultants, evaluation of patient's response to treatment, examination of patient, obtaining history from patient or surrogate, ordering and performing treatments and interventions, ordering and review of laboratory studies, ordering and review of radiographic studies, pulse oximetry and re-evaluation of patient's condition.   Medications Ordered in ED Medications  0.9 %  sodium chloride infusion ( Intravenous New Bag/Given 11/23/21 1250)  buPROPion ER (WELLBUTRIN SR) 12 hr tablet 400 mg (has no administration in time range)  lithium carbonate capsule 600 mg (has no administration in time range)  levothyroxine (SYNTHROID) tablet 100 mcg (has no administration in time range)  enoxaparin (LOVENOX) injection 40 mg (has no administration in time range)  0.9 % NaCl with KCl 20 mEq/ L  infusion (has no administration in time range)  cefTRIAXone (ROCEPHIN) 2 g in sodium chloride 0.9 % 100 mL IVPB (has no administration in time range)  acetaminophen (TYLENOL) tablet 650 mg (has no administration in time range)    Or  acetaminophen (TYLENOL) suppository 650 mg (has no administration in time range)  morphine (PF) 4 MG/ML injection 4 mg (4 mg Intravenous Given 11/23/21 1443)  diphenhydrAMINE (BENADRYL)  12.5 MG/5ML elixir 12.5 mg (has no administration in time range)    Or  diphenhydrAMINE (BENADRYL) injection 12.5 mg (has no administration in time range)  ondansetron (ZOFRAN-ODT) disintegrating tablet 4 mg ( Oral See Alternative 11/23/21 1442)    Or  ondansetron (ZOFRAN) injection 4 mg (4 mg Intravenous Given 11/23/21 1442)  ondansetron (ZOFRAN) injection 4 mg (4 mg Intravenous Given 11/23/21 1013)  HYDROmorphone (DILAUDID) injection 1 mg (1 mg Intravenous Given 11/23/21 1117)  sodium chloride 0.9 % bolus 500 mL (0 mLs Intravenous Stopped 11/23/21 1200)  iohexol (OMNIPAQUE) 300 MG/ML solution 100 mL (100 mLs Intravenous Contrast Given 11/23/21 1123)  cefTRIAXone (ROCEPHIN) 2 g in sodium chloride 0.9 % 100 mL IVPB (2 g Intravenous New Bag/Given 11/23/21 1253)  HYDROmorphone (DILAUDID) injection 1 mg (1 mg Intravenous Given 11/23/21 1313)  ED Course/ Medical Decision Making/ A&P                           Medical Decision Making Amount and/or Complexity of Data Reviewed Labs: ordered. Radiology: ordered.  Risk Prescription drug management. Decision regarding hospitalization.    Patrick Webster. is a 66 y.o. male with a past medical history significant for hypertension, hyperlipidemia, partial thyroidectomy, and previous umbilical and inguinal hernia status post repair 2 years ago who presents with right abdominal pain.  According to patient, starting about 6 days ago, he had onset of pain in his right upper quadrant and epigastric area that has waxed and waned.  He reports that intermittent nausea and vomiting but had more today.  He reports some diarrhea but denies constipation.  He denies any trauma or rashes to suggest shingles.  He reports this pain is new.  He reports the pain is not on the left side where he had his previous hernias.  He does report the pain is in his right CVA and right side.  Denies history of kidney stones, gallbladder disease, appendicitis, or right-sided  diverticulitis.  Reports the pain is up to a 9 out of 10 in severity right now he just vomited in triage.  He denies any groin or testicle pains and denies any other complaints today.  On exam, patient does have moderate abdominal tenderness in his right upper quadrant primarily.  Also some right CVA tenderness, right flank, and periumbilical discomfort and tenderness.  Bowel sounds were appreciated.  No rash seen.  Lungs clear and chest nontender.  No murmur on my exam.  Good pulses in extremities.  Patient is uncomfortable resting.  Dry mucous membranes.  We will give some fluids for rehydration as well as some pain medicine to go along with nausea medicine.  Will remain n.p.o.  Will get CT scan given his history of surgical problems with this abdominal pain.  We will primarily be looking for acute cholecystitis, diverticulitis, bowel obstruction, kidney stone, or appendicitis.  We will get some screening labs including urinalysis given the CVA discomfort.  Anticipate reassessment after work-up to determine disposition.  Work-up returned showing evidence of acute cholecystitis.  Spoke to general surgery who agrees with admission for further management.  Dr. Corliss Skains recommended Rocephin and admission to his service.  This was ordered.  Patient transferred for further management of acute cholecystitis.        Final Clinical Impression(s) / ED Diagnoses Final diagnoses:  Cholecystitis, acute    Clinical Impression: 1. Cholecystitis, acute     Disposition: Admit  This note was prepared with assistance of Dragon voice recognition software. Occasional wrong-word or sound-a-like substitutions may have occurred due to the inherent limitations of voice recognition software.        Daliana Leverett, Canary Brim, MD 11/23/21 [phone]

## 2021-11-24 ENCOUNTER — Inpatient Hospital Stay (HOSPITAL_COMMUNITY): Payer: Medicare Other | Admitting: Anesthesiology

## 2021-11-24 ENCOUNTER — Inpatient Hospital Stay (HOSPITAL_COMMUNITY): Payer: Medicare Other

## 2021-11-24 ENCOUNTER — Other Ambulatory Visit: Payer: Self-pay

## 2021-11-24 ENCOUNTER — Encounter (HOSPITAL_COMMUNITY): Admission: EM | Disposition: A | Discharge status: Home / Self Care | Payer: Self-pay | Source: Home / Self Care

## 2021-11-24 ENCOUNTER — Encounter (HOSPITAL_COMMUNITY): Payer: Self-pay

## 2021-11-24 DIAGNOSIS — K819 Cholecystitis, unspecified: Secondary | ICD-10-CM

## 2021-11-24 HISTORY — PX: CHOLECYSTECTOMY: SHX55

## 2021-11-24 LAB — COMPREHENSIVE METABOLIC PANEL
ALT: 48 U/L — ABNORMAL HIGH (ref 0–44)
AST: 30 U/L (ref 15–41)
Albumin: 4.1 g/dL (ref 3.5–5.0)
Alkaline Phosphatase: 64 U/L (ref 38–126)
Anion gap: 8 (ref 5–15)
BUN: 15 mg/dL (ref 8–23)
CO2: 23 mmol/L (ref 22–32)
Calcium: 9 mg/dL (ref 8.9–10.3)
Chloride: 107 mmol/L (ref 98–111)
Creatinine, Ser: 1.02 mg/dL (ref 0.61–1.24)
GFR, Estimated: >60 mL/min (ref >60)
Glucose, Bld: 138 mg/dL — ABNORMAL HIGH (ref 70–99)
Potassium: 4.6 mmol/L (ref 3.5–5.1)
Sodium: 138 mmol/L (ref 135–145)
Total Bilirubin: 0.7 mg/dL (ref 0.3–1.2)
Total Protein: 6.9 g/dL (ref 6.5–8.1)

## 2021-11-24 LAB — URINE CULTURE: Culture: <10000 — AB

## 2021-11-24 LAB — CBC
HCT: 45.5 % (ref 39.0–52.0)
Hemoglobin: 14.6 g/dL (ref 13.0–17.0)
MCH: 30 pg (ref 26.0–34.0)
MCHC: 32.1 g/dL (ref 30.0–36.0)
MCV: 93.4 fL (ref 80.0–100.0)
Platelets: 219 10*3/uL (ref 150–400)
RBC: 4.87 MIL/uL (ref 4.22–5.81)
RDW: 13.3 % (ref 11.5–15.5)
WBC: 18.7 10*3/uL — ABNORMAL HIGH (ref 4.0–10.5)
nRBC: 0 % (ref 0.0–0.2)

## 2021-11-24 SURGERY — LAPAROSCOPIC CHOLECYSTECTOMY WITH INTRAOPERATIVE CHOLANGIOGRAM
Anesthesia: General | Site: Abdomen

## 2021-11-24 MED ORDER — PROPOFOL 10 MG/ML IV BOLUS
INTRAVENOUS | Status: AC
  Filled 2021-11-24: qty 20

## 2021-11-24 MED ORDER — MIDAZOLAM HCL 2 MG/2ML IJ SOLN
INTRAMUSCULAR | Status: AC
  Filled 2021-11-24: qty 2

## 2021-11-24 MED ORDER — ROCURONIUM BROMIDE 10 MG/ML (PF) SYRINGE
PREFILLED_SYRINGE | INTRAVENOUS | Status: AC
  Filled 2021-11-24: qty 10

## 2021-11-24 MED ORDER — LIDOCAINE 2% (20 MG/ML) 5 ML SYRINGE
INTRAMUSCULAR | Status: AC
  Filled 2021-11-24: qty 5

## 2021-11-24 MED ORDER — ROCURONIUM BROMIDE 10 MG/ML (PF) SYRINGE
PREFILLED_SYRINGE | INTRAVENOUS | Status: DC | PRN
  Administered 2021-11-24: 60 mg via INTRAVENOUS

## 2021-11-24 MED ORDER — PROPOFOL 10 MG/ML IV BOLUS
INTRAVENOUS | Status: DC | PRN
  Administered 2021-11-24: 150 mg via INTRAVENOUS

## 2021-11-24 MED ORDER — MIDAZOLAM HCL 5 MG/5ML IJ SOLN
INTRAMUSCULAR | Status: DC | PRN
  Administered 2021-11-24: 2 mg via INTRAVENOUS

## 2021-11-24 MED ORDER — DOXEPIN HCL 50 MG PO CAPS
100.0000 mg | ORAL_CAPSULE | Freq: Every day | ORAL | Status: DC
  Administered 2021-11-24 – 2021-11-25 (×2): 100 mg via ORAL
  Filled 2021-11-24 (×2): qty 2

## 2021-11-24 MED ORDER — FENTANYL CITRATE (PF) 100 MCG/2ML IJ SOLN
INTRAMUSCULAR | Status: DC | PRN
  Administered 2021-11-24: 100 ug via INTRAVENOUS
  Administered 2021-11-24 (×2): 50 ug via INTRAVENOUS

## 2021-11-24 MED ORDER — BUPIVACAINE-EPINEPHRINE (PF) 0.5% -1:200000 IJ SOLN
INTRAMUSCULAR | Status: DC | PRN
  Administered 2021-11-24: 20 mL

## 2021-11-24 MED ORDER — DEXAMETHASONE SODIUM PHOSPHATE 10 MG/ML IJ SOLN
INTRAMUSCULAR | Status: AC
  Filled 2021-11-24: qty 1

## 2021-11-24 MED ORDER — ONDANSETRON HCL 4 MG/2ML IJ SOLN
INTRAMUSCULAR | Status: AC
  Filled 2021-11-24: qty 2

## 2021-11-24 MED ORDER — CHLORHEXIDINE GLUCONATE 0.12 % MT SOLN: 15.0000 mL | Freq: Once | OROMUCOSAL | Status: DC

## 2021-11-24 MED ORDER — DEXTROSE 5 % IV SOLN
INTRAVENOUS | Status: DC | PRN
  Administered 2021-11-24: 2 g via INTRAVENOUS

## 2021-11-24 MED ORDER — PROPOFOL 500 MG/50ML IV EMUL
INTRAVENOUS | Status: AC
  Filled 2021-11-24: qty 50

## 2021-11-24 MED ORDER — LACTATED RINGERS IV SOLN: INTRAVENOUS | Status: DC

## 2021-11-24 MED ORDER — BUPIVACAINE-EPINEPHRINE 0.25% -1:200000 IJ SOLN: INTRAMUSCULAR | Status: DC | PRN

## 2021-11-24 MED ORDER — PHENYLEPHRINE 80 MCG/ML (10ML) SYRINGE FOR IV PUSH (FOR BLOOD PRESSURE SUPPORT)
PREFILLED_SYRINGE | INTRAVENOUS | Status: DC | PRN
  Administered 2021-11-24: 160 ug via INTRAVENOUS

## 2021-11-24 MED ORDER — BUPIVACAINE-EPINEPHRINE (PF) 0.5% -1:200000 IJ SOLN
INTRAMUSCULAR | Status: AC
  Filled 2021-11-24: qty 30

## 2021-11-24 MED ORDER — FENTANYL CITRATE (PF) 100 MCG/2ML IJ SOLN
INTRAMUSCULAR | Status: AC
  Filled 2021-11-24: qty 2

## 2021-11-24 MED ORDER — COLCHICINE 0.6 MG PO TABS
0.6000 mg | ORAL_TABLET | Freq: Every day | ORAL | Status: DC
  Administered 2021-11-24 – 2021-11-26 (×3): 0.6 mg via ORAL
  Filled 2021-11-24 (×3): qty 1

## 2021-11-24 MED ORDER — LIDOCAINE 2% (20 MG/ML) 5 ML SYRINGE
INTRAMUSCULAR | Status: DC | PRN
  Administered 2021-11-24: 100 mg via INTRAVENOUS

## 2021-11-24 MED ORDER — SUCCINYLCHOLINE CHLORIDE 200 MG/10ML IV SOSY
PREFILLED_SYRINGE | INTRAVENOUS | Status: DC | PRN
  Administered 2021-11-24: 140 mg via INTRAVENOUS

## 2021-11-24 MED ORDER — PROPOFOL 500 MG/50ML IV EMUL
INTRAVENOUS | Status: DC | PRN
  Administered 2021-11-24: 50 ug/kg/min via INTRAVENOUS

## 2021-11-24 MED ORDER — DEXAMETHASONE SODIUM PHOSPHATE 10 MG/ML IJ SOLN
INTRAMUSCULAR | Status: DC | PRN
  Administered 2021-11-24: 10 mg via INTRAVENOUS

## 2021-11-24 MED ORDER — ONDANSETRON HCL 4 MG/2ML IJ SOLN
INTRAMUSCULAR | Status: DC | PRN
  Administered 2021-11-24: 4 mg via INTRAVENOUS

## 2021-11-24 MED ORDER — DOXEPIN HCL 50 MG PO CAPS: 50.0000 mg | ORAL_CAPSULE | Freq: Every day | ORAL | Status: DC

## 2021-11-24 MED ORDER — SUGAMMADEX SODIUM 200 MG/2ML IV SOLN
INTRAVENOUS | Status: DC | PRN
  Administered 2021-11-24: 200 mg via INTRAVENOUS

## 2021-11-24 MED ORDER — TAMSULOSIN HCL 0.4 MG PO CAPS
0.4000 mg | ORAL_CAPSULE | Freq: Every day | ORAL | Status: DC
  Administered 2021-11-24 – 2021-11-25 (×2): 0.4 mg via ORAL
  Filled 2021-11-24 (×2): qty 1

## 2021-11-24 MED ORDER — LACTATED RINGERS IV SOLN
INTRAVENOUS | Status: AC | PRN
  Administered 2021-11-24: 1000 mL

## 2021-11-24 MED ORDER — LEVOTHYROXINE SODIUM 100 MCG PO TABS: 100.0000 ug | ORAL_TABLET | Freq: Every day | ORAL | Status: DC

## 2021-11-24 MED ORDER — ORAL CARE MOUTH RINSE: 15.0000 mL | Freq: Once | OROMUCOSAL | Status: DC

## 2021-11-24 SURGICAL SUPPLY — 40 items
ADH SKN CLS APL DERMABOND .7 (GAUZE/BANDAGES/DRESSINGS) ×1
APL PRP STRL LF DISP 70% ISPRP (MISCELLANEOUS) ×1
APPLIER CLIP 5 13 M/L LIGAMAX5 (MISCELLANEOUS) ×2
APR CLP MED LRG 5 ANG JAW (MISCELLANEOUS) ×1
BAG COUNTER SPONGE SURGICOUNT (BAG) IMPLANT
BAG SPNG CNTER NS LX DISP (BAG)
CABLE HIGH FREQUENCY MONO STRZ (ELECTRODE) ×2 IMPLANT
CATH REDDICK CHOLANGI 4FR 50CM (CATHETERS) ×2 IMPLANT
CHLORAPREP W/TINT 26 (MISCELLANEOUS) ×2 IMPLANT
CLIP APPLIE 5 13 M/L LIGAMAX5 (MISCELLANEOUS) ×1 IMPLANT
COVER MAYO STAND XLG (MISCELLANEOUS) ×2 IMPLANT
DERMABOND ADVANCED (GAUZE/BANDAGES/DRESSINGS) ×1
DERMABOND ADVANCED .7 DNX12 (GAUZE/BANDAGES/DRESSINGS) ×1 IMPLANT
DRAIN CHANNEL 19F RND (DRAIN) ×1 IMPLANT
DRAPE C-ARM 42X120 X-RAY (DRAPES) ×2 IMPLANT
ELECT REM PT RETURN 15FT ADLT (MISCELLANEOUS) ×2 IMPLANT
EVACUATOR SILICONE 100CC (DRAIN) ×1 IMPLANT
GLOVE BIO SURGEON STRL SZ7.5 (GLOVE) ×2 IMPLANT
GOWN STRL REUS W/ TWL LRG LVL3 (GOWN DISPOSABLE) IMPLANT
GOWN STRL REUS W/TWL LRG LVL3 (GOWN DISPOSABLE) ×2
HEMOSTAT SURGICEL 4X8 (HEMOSTASIS) IMPLANT
IRRIG SUCT STRYKERFLOW 2 WTIP (MISCELLANEOUS) ×2
IRRIGATION SUCT STRKRFLW 2 WTP (MISCELLANEOUS) ×1 IMPLANT
IV CATH 14GX2 1/4 (CATHETERS) ×2 IMPLANT
KIT BASIN OR (CUSTOM PROCEDURE TRAY) ×2 IMPLANT
KIT TURNOVER KIT A (KITS) ×1 IMPLANT
PENCIL SMOKE EVACUATOR (MISCELLANEOUS) IMPLANT
SCISSORS LAP 5X35 DISP (ENDOMECHANICALS) ×2 IMPLANT
SET TUBE SMOKE EVAC HIGH FLOW (TUBING) ×2 IMPLANT
SLEEVE Z-THREAD 5X100MM (TROCAR) ×4 IMPLANT
SPIKE FLUID TRANSFER (MISCELLANEOUS) ×2 IMPLANT
SUT ETHILON 2 0 PS N (SUTURE) ×1 IMPLANT
SUT MNCRL AB 4-0 PS2 18 (SUTURE) ×2 IMPLANT
SYS BAG RETRIEVAL 10MM (BASKET) ×2
SYSTEM BAG RETRIEVAL 10MM (BASKET) ×1 IMPLANT
TOWEL OR 17X26 10 PK STRL BLUE (TOWEL DISPOSABLE) ×2 IMPLANT
TOWEL OR NON WOVEN STRL DISP B (DISPOSABLE) ×2 IMPLANT
TRAY LAPAROSCOPIC (CUSTOM PROCEDURE TRAY) ×2 IMPLANT
TROCAR BALLN 12MMX100 BLUNT (TROCAR) ×2 IMPLANT
TROCAR Z-THREAD OPTICAL 5X100M (TROCAR) ×2 IMPLANT

## 2021-11-24 NOTE — Anesthesia Preprocedure Evaluation (Addendum)
Anesthesia Evaluation  Patient identified by MRN, date of birth, ID band Patient awake    Reviewed: Allergy & Precautions, NPO status , Patient's Chart, lab work & pertinent test results  History of Anesthesia Complications (+) PONV and history of anesthetic complications  Airway Mallampati: II  TM Distance: >3 FB Neck ROM: Full    Dental no notable dental hx. (+) Teeth Intact, Dental Advisory Given   Pulmonary    Pulmonary exam normal breath sounds clear to auscultation       Cardiovascular Exercise Tolerance: Good Normal cardiovascular exam Rhythm:Regular Rate:Normal     Neuro/Psych PSYCHIATRIC DISORDERS Depression Bipolar Disorder negative neurological ROS     GI/Hepatic negative GI ROS, Neg liver ROS,   Endo/Other    Renal/GU negative Renal ROS     Musculoskeletal   Abdominal   Peds  Hematology   Anesthesia Other Findings Cholelithiasis  Reproductive/Obstetrics                             Anesthesia Physical  Anesthesia Plan  ASA: II and emergent  Anesthesia Plan: General   Post-op Pain Management:  Regional for Post-op pain and Dilaudid IV   Induction: Intravenous, Rapid sequence and Cricoid pressure planned  PONV Risk Score and Plan: 3 and Treatment may vary due to age or medical condition, Ondansetron, Dexamethasone and Midazolam  Airway Management Planned: Oral ETT  Additional Equipment: None  Intra-op Plan:   Post-operative Plan: Extubation in OR  Informed Consent: I have reviewed the patients History and Physical, chart, labs and discussed the procedure including the risks, benefits and alternatives for the proposed anesthesia with the patient or authorized representative who has indicated his/her understanding and acceptance.     Dental advisory given  Plan Discussed with: CRNA and Anesthesiologist  Anesthesia Plan Comments:        Anesthesia Quick  Evaluation

## 2021-11-24 NOTE — Anesthesia Postprocedure Evaluation (Signed)
Anesthesia Post Note  Patient: Patrick Webster.  Procedure(s) Performed: LAPAROSCOPIC CHOLECYSTECTOMY WITH INTRAOPERATIVE CHOLANGIOGRAM (Abdomen)     Patient location during evaluation: PACU Anesthesia Type: General Level of consciousness: awake and alert Pain management: pain level controlled Vital Signs Assessment: post-procedure vital signs reviewed and stable Respiratory status: spontaneous breathing, nonlabored ventilation, respiratory function stable and patient connected to nasal cannula oxygen Cardiovascular status: blood pressure returned to baseline and stable Postop Assessment: no apparent nausea or vomiting Anesthetic complications: yes   Encounter Notable Events  Notable Event Outcome Phase Comment  Difficult to intubate - expected  Intraprocedure Filed from anesthesia note documentation.    Last Vitals:  Vitals:   11/24/21 1353 11/24/21 1501  BP: (!) 144/88 (!) 151/81  Pulse: 90 86  Resp: 16 17  Temp: 36.7 C 36.6 C  SpO2: 95% 99%    Last Pain:  Vitals:   11/24/21 1501  TempSrc: Oral  PainSc:                  Maurine Mowbray

## 2021-11-24 NOTE — Anesthesia Procedure Notes (Signed)
Procedure Name: Intubation Date/Time: 11/24/2021 11:21 AM  Performed by: Lind Covert, CRNAPre-anesthesia Checklist: Patient identified, Emergency Drugs available, Suction available, Patient being monitored and Timeout performed Patient Re-evaluated:Patient Re-evaluated prior to induction Oxygen Delivery Method: Circle system utilized Preoxygenation: Pre-oxygenation with 100% oxygen Induction Type: IV induction, Rapid sequence and Cricoid Pressure applied Laryngoscope Size: Mac, 4 and Glidescope Grade View: Grade I Tube type: Oral Tube size: 7.5 mm Number of attempts: 1 Airway Equipment and Method: Stylet and Video-laryngoscopy Placement Confirmation: ETT inserted through vocal cords under direct vision, positive ETCO2 and breath sounds checked- equal and bilateral Secured at: 22 cm Tube secured with: Tape Dental Injury: Teeth and Oropharynx as per pre-operative assessment  Difficulty Due To: Difficulty was anticipated, Difficult Airway- due to anterior larynx and Difficult Airway- due to reduced neck mobility Future Recommendations: Recommend- induction with short-acting agent, and alternative techniques readily available

## 2021-11-24 NOTE — Op Note (Signed)
11/24/2021  12:41 PM  PATIENT:  Patrick Webster.  66 y.o. male  PRE-OPERATIVE DIAGNOSIS:  Cholecystitis  POST-OPERATIVE DIAGNOSIS:  Cholecystitis  PROCEDURE:  Procedure(s): LAPAROSCOPIC CHOLECYSTECTOMY   SURGEON:  Surgeon(s) and Role:    * Griselda Miner, MD - Primary    * White, Stephanie Coup, MD - Assisting  PHYSICIAN ASSISTANT:   ASSISTANTS: Dr. Cliffton Asters   ANESTHESIA:   local and general  EBL:  20cc   BLOOD ADMINISTERED:none  DRAINS: (1) Blake drain(s) in the gallbladder fossa of liver    LOCAL MEDICATIONS USED:  MARCAINE     SPECIMEN:  Source of Specimen:  gallbladder  DISPOSITION OF SPECIMEN:  PATHOLOGY  COUNTS:  YES  TOURNIQUET:  * No tourniquets in log *  DICTATION: .Dragon Dictation  After informed consent was obtained the patient was brought to the operating room and placed in the supine position on the operating table.  After adequate induction of general anesthesia the patient's abdomen was prepped with ChloraPrep, allowed to dry, and draped in usual sterile manner.  An appropriate timeout was performed.  The area above the umbilicus was infiltrated with quarter percent Marcaine.  A small vertically oriented incision was made with a 15 blade knife along the midline.  The incision was carried through the subcutaneous tissue bluntly with a hemostat and [employer] retractors until the linea alba was identified.  The linea alba was incised with a 15 blade knife.  Each side was grasped with Kocher clamps and elevated anteriorly.  The preperitoneal space was then probed bluntly with a hemostat until the peritoneum was opened and access was gained to the abdominal cavity.  A 0 Vicryl pursestring stitch was placed in the fascia around the opening.  A Hassan cannula is placed through the opening and anchored in place with the previously placed Vicryl pursestring stitch.  The abdomen was then insufflated with carbon dioxide without difficulty.  The camera was placed through the  [facility] cannula and the abdomen was generally inspected.  No other abnormalities were noted on general inspection of the abdomen.  A site was chosen in the epigastric region for placement of a 5 mm port.  This area was infiltrated with quarter percent Marcaine.  A small stab incision was made with a 15 blade knife.  5 mm port was then placed bluntly through the incision into the abdominal cavity under direct vision.  Two 5 mm ports were then placed in a similar manner on the right lateral abdomen under direct vision.  A blunt grasper was placed through the lateralmost 5 mm port and used to identify the gallbladder.  There were some filmy adhesions to the body of the gallbladder.  The gallbladder was very thick-walled and inflamed.  The only way to grasp it was to aspirate it with a Nijat suction aspirator which we did.  We were then able to grasp the dome of the gallbladder and elevated anteriorly and superiorly.  I was able to then dissected out the base of the gallbladder where it joined to the cystic duct with blunt dissection with the suction tip and dolphin nose dissector.  I was able to identify the cystic duct where it joined to the gallbladder.  We were not able to get a cholangiogram because of the amount of inflammation and necrosis of the wall of the gallbladder.  I was able to get a clip on the cystic duct stump as well as 2 clips on the cystic artery just above it.  I then divided the cystic duct and cystic artery on the gallbladder side with the hook cautery.  I was then able to dissected the gallbladder off the liver also using the hook cautery.  Once this was accomplished the gallbladder was freed from the liver.  A laparoscopic bag was inserted through the [facility] cannula and the gallbladder was placed within the bag and the bag was sealed.  The bag and gallbladder were then removed with the [facility] cannula through the supraumbilical port.  I did have to enlarge the incision slightly to get the  gallbladder out.  I then replaced the Hassan cannula and the abdomen was inspected again.  The liver bed was fulgurated with the cautery until it was completely hemostatic.  The abdomen is then irrigated with copious amounts of saline.  A blunt grasper was placed through the lateralmost 5 mm port and up through the epigastric port.  This was used to bring a 64 [place] round Blake drain into the operative bed.  The drain was placed along the gallbladder fossa of the liver.  The drain was anchored to the skin with a 3-0 nylon stitch.  The drain was placed to bulb suction and there was a good seal.  The fascial defect at the [facility] cannula site was then closed with multiple interrupted figure-of-eight 0 Vicryl stitches as well as the previously placed Vicryl pursestring stitch.  The rest of the ports were removed under direct vision and were found to be hemostatic.  The gas was allowed to escape.  The incisions were then closed with interrupted 4-0 Monocryl subcuticular stitches.  Dermabond dressings and sterile drain dressings were applied.  The patient tolerated the procedure well.  At the end of the case all needle sponge and instrument counts were correct.  The patient was then awakened and taken to recovery in stable condition.  The assistant was instrumental in retraction and visualization for completion of the case.  PLAN OF CARE: Admit to inpatient   PATIENT DISPOSITION:  PACU - hemodynamically stable.   Delay start of Pharmacological VTE agent (>24hrs) due to surgical blood loss or risk of bleeding: no

## 2021-11-24 NOTE — Transfer of Care (Signed)
Immediate Anesthesia Transfer of Care Note  Patient: Patrick Webster.  Procedure(s) Performed: LAPAROSCOPIC CHOLECYSTECTOMY WITH INTRAOPERATIVE CHOLANGIOGRAM (Abdomen)  Patient Location: PACU  Anesthesia Type:General  Level of Consciousness: sedated  Airway & Oxygen Therapy: Patient Spontanous Breathing and Patient connected to face mask oxygen  Post-op Assessment: Report given to RN and Post -op Vital signs reviewed and stable  Post vital signs: Reviewed and stable  Last Vitals:  Vitals Value Taken Time  BP 123/66 11/24/21 1246  Temp    Pulse 95 11/24/21 1248  Resp 22 11/24/21 1248  SpO2 98 % 11/24/21 1248  Vitals shown include unvalidated device data.  Last Pain:  Vitals:   11/24/21 1006  TempSrc:   PainSc: 8       Patients Stated Pain Goal: 2 (11/24/21 0503)  Complications:  Encounter Notable Events  Notable Event Outcome Phase Comment  Difficult to intubate - expected  Intraprocedure Filed from anesthesia note documentation.

## 2021-11-24 NOTE — Progress Notes (Signed)
Subjective/Chief Complaint: Complains of nausea and vomiting   Objective: Vital signs in last 24 hours: Temp:  [97.5 F (36.4 C)-98.8 F (37.1 C)] 98.8 F (37.1 C) (08/14 0624) Pulse Rate:  [54-96] 96 (08/14 0624) Resp:  [16-21] 18 (08/14 0624) BP: (133-172)/(77-100) 161/89 (08/14 0624) SpO2:  [93 %-100 %] 93 % (08/14 0624) Weight:  [83.9 kg] 83.9 kg (08/13 1440) Last BM Date : 11/22/21  Intake/Output from previous day: 08/13 0701 - 08/14 0700 In: 2417.3 [I.V.:1666.9; IV Piggyback:750.4] Out: 400 [Urine:400] Intake/Output this shift: No intake/output data recorded.  General appearance: alert and cooperative Resp: clear to auscultation bilaterally Cardio: regular rate and rhythm GI: soft, RUQ tenderness  Lab Results:  Recent Labs    11/23/21 1439 11/24/21 0435  WBC 13.5* 18.7*  HGB 14.5 14.6  HCT 44.2 45.5  PLT 212 219   BMET Recent Labs    11/23/21 1014 11/23/21 1439 11/24/21 0435  NA 136  --  138  K 4.1  --  4.6  CL 102  --  107  CO2 24  --  23  GLUCOSE 130*  --  138*  BUN 19  --  15  CREATININE 1.16 1.08 1.02  CALCIUM 9.6  --  9.0   PT/INR No results for input(s): "LABPROT", "INR" in the last 72 hours. ABG No results for input(s): "PHART", "HCO3" in the last 72 hours.  Invalid input(s): "PCO2", "PO2"  Studies/Results: CT ABDOMEN PELVIS W CONTRAST  Result Date: 11/23/2021 CLINICAL DATA:  66 year old male with acute RIGHT abdominal, flank and pelvic pain. EXAM: CT ABDOMEN AND PELVIS WITH CONTRAST TECHNIQUE: Multidetector CT imaging of the abdomen and pelvis was performed using the standard protocol following bolus administration of intravenous contrast. RADIATION DOSE REDUCTION: This exam was performed according to the departmental dose-optimization program which includes automated exposure control, adjustment of the mA and/or kV according to patient size and/or use of iterative reconstruction technique. CONTRAST:  OMNIPAQUE IOHEXOL 300  MG/ML  SOLN COMPARISON:  04/14/2013 CT FINDINGS: Lower chest: Mild bibasilar atelectasis/scarring noted. Hepatobiliary: Gallbladder wall thickening and pericholecystic inflammation is noted highly suspicious for acute cholecystitis. There is no evidence of biliary dilatation. The liver is unchanged and without significant abnormality. Pancreas: Unremarkable except for small calcification in the LOWER pancreatic head which was present on 2015 and appears to be below the ampulla. Spleen: Unremarkable Adrenals/Urinary Tract: The kidneys, adrenal glands and bladder are unremarkable except for renal cysts which need no follow-up. There is a 1.5 x 1.8 cm soft tissue mass between the RIGHT kidney and RIGHT psoas muscle (series 3: Image 42), new since 2015. Stomach/Bowel: Stomach is within normal limits. Appendix not identified. No evidence of bowel wall thickening, distention, or inflammatory changes. Vascular/Lymphatic: Aortic atherosclerosis. No enlarged abdominal or pelvic lymph nodes. Reproductive: Prostate is unremarkable. Other: No ascites, focal collection or pneumoperitoneum. Small RIGHT inguinal hernia containing fat. Evidence of LEFT inguinal hernia repair identified. Musculoskeletal: No acute or suspicious bony abnormalities are noted. IMPRESSION: 1. Gallbladder wall thickening and pericholecystic inflammation highly suspicious for acute cholecystitis. No evidence of biliary dilatation. 2. Indeterminate 1.5 x 1.8 cm soft tissue mass/lymph node between the RIGHT kidney and RIGHT psoas muscle, new since 2015. Recommend further evaluation with PET CT, sampling or very short-term follow-up. 3. Small RIGHT inguinal hernia containing fat. 4. Aortic Atherosclerosis (ICD10-I70.0). Electronically Signed   By: Harmon Pier M.D.   On: 11/23/2021 11:43    Anti-infectives: Anti-infectives (From admission, onward)    Start  Dose/Rate Route Frequency Ordered Stop   11/24/21 1300  cefTRIAXone (ROCEPHIN) 2 g in sodium  chloride 0.9 % 100 mL IVPB        2 g 200 mL/hr over 30 Minutes Intravenous Every 24 hours 11/23/21 1456 12/01/21 1259   11/23/21 1515  cefTRIAXone (ROCEPHIN) 2 g in sodium chloride 0.9 % 100 mL IVPB  Status:  Discontinued        2 g 200 mL/hr over 30 Minutes Intravenous Every 24 hours 11/23/21 1418 11/23/21 1456   11/23/21 1245  cefTRIAXone (ROCEPHIN) 2 g in sodium chloride 0.9 % 100 mL IVPB        2 g 200 mL/hr over 30 Minutes Intravenous  Once 11/23/21 1240 11/23/21 1323       Assessment/Plan: s/p * No surgery found * Plan for lap chole today. Risks and benefits of the surgery as well as some of the technical aspects discussed with patient and he understands and wishes to proceed  LOS: 1 day    Patrick Webster 11/24/2021

## 2021-11-25 ENCOUNTER — Encounter (HOSPITAL_COMMUNITY): Payer: Self-pay | Admitting: General Surgery

## 2021-11-25 LAB — CBC
HCT: 38.4 % — ABNORMAL LOW (ref 39.0–52.0)
Hemoglobin: 12.4 g/dL — ABNORMAL LOW (ref 13.0–17.0)
MCH: 30.5 pg (ref 26.0–34.0)
MCHC: 32.3 g/dL (ref 30.0–36.0)
MCV: 94.3 fL (ref 80.0–100.0)
Platelets: 181 10*3/uL (ref 150–400)
RBC: 4.07 MIL/uL — ABNORMAL LOW (ref 4.22–5.81)
RDW: 13.4 % (ref 11.5–15.5)
WBC: 16.9 10*3/uL — ABNORMAL HIGH (ref 4.0–10.5)
nRBC: 0 % (ref 0.0–0.2)

## 2021-11-25 LAB — COMPREHENSIVE METABOLIC PANEL
ALT: 71 U/L — ABNORMAL HIGH (ref 0–44)
AST: 54 U/L — ABNORMAL HIGH (ref 15–41)
Albumin: 3.3 g/dL — ABNORMAL LOW (ref 3.5–5.0)
Alkaline Phosphatase: 49 U/L (ref 38–126)
Anion gap: 5 (ref 5–15)
BUN: 15 mg/dL (ref 8–23)
CO2: 23 mmol/L (ref 22–32)
Calcium: 8.6 mg/dL — ABNORMAL LOW (ref 8.9–10.3)
Chloride: 113 mmol/L — ABNORMAL HIGH (ref 98–111)
Creatinine, Ser: 1.09 mg/dL (ref 0.61–1.24)
GFR, Estimated: >60 mL/min (ref >60)
Glucose, Bld: 103 mg/dL — ABNORMAL HIGH (ref 70–99)
Potassium: 4.1 mmol/L (ref 3.5–5.1)
Sodium: 141 mmol/L (ref 135–145)
Total Bilirubin: 0.6 mg/dL (ref 0.3–1.2)
Total Protein: 6.1 g/dL — ABNORMAL LOW (ref 6.5–8.1)

## 2021-11-25 LAB — SURGICAL PATHOLOGY

## 2021-11-25 MED ORDER — OXYCODONE HCL 5 MG PO TABS
5.0000 mg | ORAL_TABLET | ORAL | Status: DC | PRN
  Administered 2021-11-25 – 2021-11-26 (×2): 10 mg via ORAL
  Filled 2021-11-25 (×2): qty 2

## 2021-11-25 MED ORDER — HYDROMORPHONE HCL 1 MG/ML IJ SOLN: 0.5000 mg | INTRAMUSCULAR | Status: DC | PRN

## 2021-11-25 NOTE — Progress Notes (Signed)
1 Day Post-Op   Subjective/Chief Complaint: Some abdominal pain from surgery. No nausea or emesis on soft diet. Has not ambulated much. No BM yet.   Objective: Vital signs in last 24 hours: Temp:  [97.8 F (36.6 C)-99.5 F (37.5 C)] 99.5 F (37.5 C) (08/15 0539) Pulse Rate:  [85-99] 85 (08/15 0539) Resp:  [16-21] 18 (08/15 0539) BP: (113-158)/(60-92) 113/60 (08/15 0539) SpO2:  [91 %-99 %] 91 % (08/15 0539) Last BM Date : 11/22/21  Intake/Output from previous day: 08/14 0701 - 08/15 0700 In: 4650.5 [P.O.:1320; I.V.:3230.5; IV Piggyback:100] Out: 1210 [Urine:1000; Drains:210] Intake/Output this shift: Total I/O In: 480 [P.O.:480] Out: 15 [Drains:15]  General appearance: alert and cooperative Resp: unlabored on room air Cardio: regular rate and rhythm GI: soft, mild TTP around incisions and drain. Incision c/d/I with surgical glue. Drain with bloody output. Not bilious  Lab Results:  Recent Labs    11/24/21 0435 11/25/21 0524  WBC 18.7* 16.9*  HGB 14.6 12.4*  HCT 45.5 38.4*  PLT 219 181    BMET Recent Labs    11/24/21 0435 11/25/21 0524  NA 138 141  K 4.6 4.1  CL 107 113*  CO2 23 23  GLUCOSE 138* 103*  BUN 15 15  CREATININE 1.02 1.09  CALCIUM 9.0 8.6*    PT/INR No results for input(s): "LABPROT", "INR" in the last 72 hours. ABG No results for input(s): "PHART", "HCO3" in the last 72 hours.  Invalid input(s): "PCO2", "PO2"  Studies/Results: DG C-Arm 1-60 Min-No Report  Result Date: 11/24/2021 Fluoroscopy was utilized by the requesting physician.  No radiographic interpretation.   CT ABDOMEN PELVIS W CONTRAST  Result Date: 11/23/2021 CLINICAL DATA:  66 year old male with acute RIGHT abdominal, flank and pelvic pain. EXAM: CT ABDOMEN AND PELVIS WITH CONTRAST TECHNIQUE: Multidetector CT imaging of the abdomen and pelvis was performed using the standard protocol following bolus administration of intravenous contrast. RADIATION DOSE REDUCTION: This exam  was performed according to the departmental dose-optimization program which includes automated exposure control, adjustment of the mA and/or kV according to patient size and/or use of iterative reconstruction technique. CONTRAST:  OMNIPAQUE IOHEXOL 300 MG/ML  SOLN COMPARISON:  04/14/2013 CT FINDINGS: Lower chest: Mild bibasilar atelectasis/scarring noted. Hepatobiliary: Gallbladder wall thickening and pericholecystic inflammation is noted highly suspicious for acute cholecystitis. There is no evidence of biliary dilatation. The liver is unchanged and without significant abnormality. Pancreas: Unremarkable except for small calcification in the LOWER pancreatic head which was present on 2015 and appears to be below the ampulla. Spleen: Unremarkable Adrenals/Urinary Tract: The kidneys, adrenal glands and bladder are unremarkable except for renal cysts which need no follow-up. There is a 1.5 x 1.8 cm soft tissue mass between the RIGHT kidney and RIGHT psoas muscle (series 3: Image 42), new since 2015. Stomach/Bowel: Stomach is within normal limits. Appendix not identified. No evidence of bowel wall thickening, distention, or inflammatory changes. Vascular/Lymphatic: Aortic atherosclerosis. No enlarged abdominal or pelvic lymph nodes. Reproductive: Prostate is unremarkable. Other: No ascites, focal collection or pneumoperitoneum. Small RIGHT inguinal hernia containing fat. Evidence of LEFT inguinal hernia repair identified. Musculoskeletal: No acute or suspicious bony abnormalities are noted. IMPRESSION: 1. Gallbladder wall thickening and pericholecystic inflammation highly suspicious for acute cholecystitis. No evidence of biliary dilatation. 2. Indeterminate 1.5 x 1.8 cm soft tissue mass/lymph node between the RIGHT kidney and RIGHT psoas muscle, new since 2015. Recommend further evaluation with PET CT, sampling or very short-term follow-up. 3. Small RIGHT inguinal hernia containing fat. 4.  Aortic Atherosclerosis  (ICD10-I70.0). Electronically Signed   By: Harmon Pier M.D.   On: 11/23/2021 11:43    Anti-infectives: Anti-infectives (From admission, onward)    Start     Dose/Rate Route Frequency Ordered Stop   11/24/21 1300  cefTRIAXone (ROCEPHIN) 2 g in sodium chloride 0.9 % 100 mL IVPB        2 g 200 mL/hr over 30 Minutes Intravenous Every 24 hours 11/23/21 1456 12/01/21 1259   11/23/21 1515  cefTRIAXone (ROCEPHIN) 2 g in sodium chloride 0.9 % 100 mL IVPB  Status:  Discontinued        2 g 200 mL/hr over 30 Minutes Intravenous Every 24 hours 11/23/21 1418 11/23/21 1456   11/23/21 1245  cefTRIAXone (ROCEPHIN) 2 g in sodium chloride 0.9 % 100 mL IVPB        2 g 200 mL/hr over 30 Minutes Intravenous  Once 11/23/21 1240 11/23/21 1323       Assessment/Plan: Cholecystitis POD1 lap chole Dr. Carolynne Edouard 8/14 - tolerating diet - drain 210 ml last 24 H. Continue to monitor - continue IV abx - AST/ALT very mildly elevated. T bili normal. Recheck in am - possible DC as early as tomorrow  FEN: soft, dc IVF ID: [email or web address] VTE: lovenox   LOS: 2 days   Eric Form, [facility] 11/25/2021, 10:31 AM Please see Amion for pager number during day hours 7:00am-4:30pm

## 2021-11-25 NOTE — Progress Notes (Signed)
Mobility Specialist - Progress Note   11/25/21 0900  Mobility  HOB Elevated/Bed Position Self regulated  Activity Ambulated independently in hallway  Range of Motion/Exercises Active  Level of Assistance Independent  Assistive Device None  Distance Ambulated (ft) 750 ft  Activity Response Tolerated well  Transport method Ambulatory  $Mobility charge 1 Mobility   Pt received in bed & agreeable to ambulate. Pt mentioned feeling some soreness from procedure. Pt left in bed w/ necessities in reach.   [facility]

## 2021-11-25 NOTE — Progress Notes (Signed)
  Transition of Care [facility]) Screening Note   Patient Details  Name: Patrick Webster. Date of Birth: 07-Dec-1955   Transition of Care [facility]) CM/SW Contact:    Amada Jupiter, LCSW Phone Number: 11/25/2021, 10:47 AM    Transition of Care Department [facility]) has reviewed patient and no TOC needs have been identified at this time. We will continue to monitor patient advancement through interdisciplinary progression rounds. If new patient transition needs arise, please place a TOC consult.

## 2021-11-26 LAB — COMPREHENSIVE METABOLIC PANEL
ALT: 83 U/L — ABNORMAL HIGH (ref 0–44)
AST: 47 U/L — ABNORMAL HIGH (ref 15–41)
Albumin: 3.3 g/dL — ABNORMAL LOW (ref 3.5–5.0)
Alkaline Phosphatase: 55 U/L (ref 38–126)
Anion gap: 6 (ref 5–15)
BUN: 17 mg/dL (ref 8–23)
CO2: 25 mmol/L (ref 22–32)
Calcium: 8.5 mg/dL — ABNORMAL LOW (ref 8.9–10.3)
Chloride: 110 mmol/L (ref 98–111)
Creatinine, Ser: 1.25 mg/dL — ABNORMAL HIGH (ref 0.61–1.24)
GFR, Estimated: >60 mL/min (ref >60)
Glucose, Bld: 98 mg/dL (ref 70–99)
Potassium: 3.6 mmol/L (ref 3.5–5.1)
Sodium: 141 mmol/L (ref 135–145)
Total Bilirubin: 0.4 mg/dL (ref 0.3–1.2)
Total Protein: 6 g/dL — ABNORMAL LOW (ref 6.5–8.1)

## 2021-11-26 MED ORDER — ACETAMINOPHEN 325 MG PO TABS: 650.0000 mg | ORAL_TABLET | Freq: Four times a day (QID) | ORAL | Status: DC | PRN

## 2021-11-26 MED ORDER — HYDROCODONE-ACETAMINOPHEN 5-325 MG PO TABS: 1.0000 | ORAL_TABLET | Freq: Four times a day (QID) | ORAL | 0 refills | Status: AC | PRN

## 2021-11-26 MED ORDER — DOCUSATE SODIUM 100 MG PO CAPS
100.0000 mg | ORAL_CAPSULE | Freq: Every day | ORAL | Status: AC | PRN
Start: 2021-11-26 — End: ?

## 2021-11-26 NOTE — Progress Notes (Signed)
    11/26/21 0857  Mobility  Activity Refused mobility   Mobility Specialist Cancellation/Refusal Note:   Reason for Cancellation/Refusal: Pt declined mobility at this time.p going home.   [facility]

## 2021-11-26 NOTE — Discharge Instructions (Signed)
[facility] ______[facility] LAPAROSCOPIC SURGERY: POST OP INSTRUCTIONS Always review your discharge instruction sheet given to you by the facility where your surgery was performed. IF YOU HAVE DISABILITY OR FAMILY LEAVE FORMS, YOU MUST BRING THEM TO THE OFFICE FOR PROCESSING.   DO NOT GIVE THEM TO YOUR DOCTOR.  A prescription for pain medication may be given to you upon discharge.  Take your pain medication as prescribed, if needed.  If narcotic pain medicine is not needed, then you may take acetaminophen (Tylenol) or ibuprofen (Advil) as needed. Take your usually prescribed medications unless otherwise directed. If you need a refill on your pain medication, please contact your pharmacy.  They will contact our office to request authorization. Prescriptions will not be filled after 5pm or on week-ends. You should follow a light diet the first few days after arrival home, such as soup and crackers, etc.  Be sure to include lots of fluids daily. Most patients will experience some swelling and bruising in the area of the incisions.  Ice packs will help.  Swelling and bruising can take several days to resolve.  It is common to experience some constipation if taking pain medication after surgery.  Increasing fluid intake and taking a stool softener (such as Colace) will usually help or prevent this problem from occurring.  A mild laxative (Milk of Magnesia or Miralax) should be taken according to package instructions if there are no bowel movements after 48 hours. Unless discharge instructions indicate otherwise, you may remove your bandages 24-48 hours after surgery, and you may shower at that time.  You may have steri-strips (small skin tapes) in place directly over the incision.  These strips should be left on the skin for 7-10 days.  If your surgeon used skin glue on the incision, you may shower in 24 hours.  The glue will flake off over the next 2-3 weeks.  Any sutures or staples will be  removed at the office during your follow-up visit. ACTIVITIES:  You may resume regular (light) daily activities beginning the next day--such as daily self-care, walking, climbing stairs--gradually increasing activities as tolerated.  You may have sexual intercourse when it is comfortable.  Refrain from any heavy lifting or straining until approved by your doctor. You may drive when you are no longer taking prescription pain medication, you can comfortably wear a seatbelt, and you can safely maneuver your car and apply brakes. RETURN TO WORK:  __________________________________________________________ You should see your doctor in the office for a follow-up appointment approximately 2-3 weeks after your surgery.  Make sure that you call for this appointment within a day or two after you arrive home to insure a convenient appointment time. OTHER INSTRUCTIONS: __________________________________________________________________________________________________________________________ __________________________________________________________________________________________________________________________ WHEN TO CALL YOUR DOCTOR: Fever over 101.0 Inability to urinate Continued bleeding from incision. Increased pain, redness, or drainage from the incision. Increasing abdominal pain  The clinic staff is available to answer your questions during regular business hours.  Please don't hesitate to call and ask to speak to one of the nurses for clinical concerns.  If you have a medical emergency, go to the nearest emergency room or call 911.  A surgeon from [facility] is always on call at the hospital. [address], [address], [place], [place]  [address] ? [address] [address], [place], [place]   [address] [phone] ? [phone] ? FAX [phone] Web site: [email or web address]  

## 2021-11-26 NOTE — Progress Notes (Signed)
2 Days Post-Op   Subjective/Chief Complaint: Feels better. No nausea. No flatus yet   Objective: Vital signs in last 24 hours: Temp:  [98.4 F (36.9 C)-98.6 F (37 C)] 98.4 F (36.9 C) (08/16 0448) Pulse Rate:  [87-89] 87 (08/16 0448) Resp:  [18] 18 (08/16 0448) BP: (121-136)/(72-80) 136/80 (08/16 0448) SpO2:  [92 %-94 %] 92 % (08/16 0448) Last BM Date : 11/22/21  Intake/Output from previous day: 08/15 0701 - 08/16 0700 In: 2114.9 [P.O.:1920; I.V.:20; IV Piggyback:174.9] Out: 1380 [Urine:1225; Drains:155] Intake/Output this shift: No intake/output data recorded.  General appearance: alert and cooperative Resp: clear to auscultation bilaterally Cardio: regular rate and rhythm GI: soft, mild tenderness. Good bs. Incisions look good  Lab Results:  Recent Labs    11/24/21 0435 11/25/21 0524  WBC 18.7* 16.9*  HGB 14.6 12.4*  HCT 45.5 38.4*  PLT 219 181   BMET Recent Labs    11/25/21 0524 11/26/21 0536  NA 141 141  K 4.1 3.6  CL 113* 110  CO2 23 25  GLUCOSE 103* 98  BUN 15 17  CREATININE 1.09 1.25*  CALCIUM 8.6* 8.5*   PT/INR No results for input(s): "LABPROT", "INR" in the last 72 hours. ABG No results for input(s): "PHART", "HCO3" in the last 72 hours.  Invalid input(s): "PCO2", "PO2"  Studies/Results: DG C-Arm 1-60 Min-No Report  Result Date: 11/24/2021 Fluoroscopy was utilized by the requesting physician.  No radiographic interpretation.    Anti-infectives: Anti-infectives (From admission, onward)    Start     Dose/Rate Route Frequency Ordered Stop   11/24/21 1300  cefTRIAXone (ROCEPHIN) 2 g in sodium chloride 0.9 % 100 mL IVPB        2 g 200 mL/hr over 30 Minutes Intravenous Every 24 hours 11/23/21 1456 12/01/21 1259   11/23/21 1515  cefTRIAXone (ROCEPHIN) 2 g in sodium chloride 0.9 % 100 mL IVPB  Status:  Discontinued        2 g 200 mL/hr over 30 Minutes Intravenous Every 24 hours 11/23/21 1418 11/23/21 1456   11/23/21 1245  cefTRIAXone  (ROCEPHIN) 2 g in sodium chloride 0.9 % 100 mL IVPB        2 g 200 mL/hr over 30 Minutes Intravenous  Once 11/23/21 1240 11/23/21 1323       Assessment/Plan: s/p Procedure(s): LAPAROSCOPIC CHOLECYSTECTOMY WITH INTRAOPERATIVE CHOLANGIOGRAM (N/A) Advance diet Ambulate POD 2 lap chole Continue drain Plan for d/c later today if bowel function returns  LOS: 3 days    Patrick Webster 11/26/2021

## 2021-11-26 NOTE — Progress Notes (Signed)
Discharge instructions discussed with wife and family, verbalized agreement and understanding, discharge instructions included care and emptying of JP drain

## 2021-11-26 NOTE — Discharge Summary (Signed)
[facility] Discharge Summary   Patient ID: Patrick Webster. MRN: [id number] DOB/AGE: [birth year] 66 y.o.  Admit date: 11/23/2021 Discharge date: 11/26/2021  Admitting Diagnosis: Cholecystitis [K81.9] Acute cholecystitis due to biliary calculus [K80.00]   Discharge Diagnosis Cholecystitis status post laparoscopic cholecystectomy  Consultants None  Imaging: No results found.  Procedures Dr. Carolynne Edouard (11/24/21) - Laparoscopic Cholecystectomy with [facility]  Hospital Course:  66 year old male [facility].  Workup showed acute cholecystitis.  Patient was admitted and underwent procedure listed above.  A JP drain was placed.  Tolerated procedure well and was transferred to the floor.  Diet was advanced as tolerated.  On POD2, the patient was voiding well, tolerating diet, ambulating well, pain well controlled, vital signs stable, incisions c/d/i and felt stable for discharge home with JP drain.  Patient will follow up in our office in 1 week from surgery and knows to call with questions or concerns possible drain removal and surgical follow-up   I or a member of my team have reviewed this patient in the Controlled Substance Database  Allergies as of 11/26/2021       Reactions   Penicillins Hives, Rash   Has patient had a PCN reaction causing immediate rash, facial/tongue/throat swelling, SOB or lightheadedness with hypotension: No Has patient had a PCN reaction causing severe rash involving mucus membranes or skin necrosis: No Has patient had a PCN reaction that required hospitalization: No Has patient had a PCN reaction occurring within the last 10 years: No If all of the above answers are "NO", then may proceed with Cephalosporin use.   Clindamycin/lincomycin Nausea Only        Medication List     TAKE these medications    acetaminophen 325 MG tablet Commonly known as: TYLENOL Take 2 tablets (650 mg  total) by mouth every 6 (six) hours as needed for mild pain or moderate pain (or temp > 100).   ascorbic acid 500 MG tablet Commonly known as: VITAMIN C Take 500 mg by mouth daily.   aspirin 81 MG tablet Take 160 mg by mouth daily.   buPROPion 200 MG 12 hr tablet Commonly known as: WELLBUTRIN SR Take 400 mg by mouth daily.   CITRACAL/VITAMIN D PO Take 1 tablet by mouth daily.   colchicine 0.6 MG tablet Take 0.6 mg by mouth 2 (two) times daily.   docusate sodium 100 MG capsule Commonly known as: Colace Take 1 capsule (100 mg total) by mouth daily as needed for mild constipation or moderate constipation.   doxepin 50 MG capsule Commonly known as: SINEQUAN Take 100 mg by mouth at bedtime.   Fish Oil 1200 MG Caps Take by mouth. 2 by mouth daily   GLUCOSAMINE 1500 COMPLEX PO Take 2 tablets by mouth daily.   HYDROcodone-acetaminophen 5-325 MG tablet Commonly known as: NORCO/VICODIN Take 1 tablet by mouth every 6 (six) hours as needed for up to 5 days for moderate pain or severe pain.   ibuprofen 200 MG tablet Commonly known as: ADVIL Take 200 mg by mouth every 6 (six) hours as needed for moderate pain.   levothyroxine 100 MCG tablet Commonly known as: SYNTHROID Take 100 mcg by mouth every morning.   lithium carbonate 300 MG capsule Take 600 mg by mouth at bedtime.   meloxicam 7.5 MG tablet Commonly known as: MOBIC Take 7.5 mg by mouth daily as needed for pain.   methocarbamol 500 MG tablet Commonly known  as: ROBAXIN Take 500 mg by mouth every 6 (six) hours as needed for muscle spasms.   moxifloxacin 0.5 % ophthalmic solution Commonly known as: VIGAMOX Place 1 drop into the right eye in the morning and at bedtime.   ondansetron 4 MG tablet Commonly known as: ZOFRAN Take 4 mg by mouth every 6 (six) hours as needed for nausea or vomiting.   SUPER B COMPLEX PO Take 1 capsule by mouth daily.   tamsulosin 0.4 MG Caps capsule Commonly known as: FLOMAX Take 0.4  mg by mouth at bedtime.          Follow-up Information     Griselda Miner, MD. Go on 12/04/2021.   Specialty: General Surgery Why: please follow up on 12/04/21. please arrive at 8 am for an 8:30 am appointment to complete check in process. Please bring photo ID and insurance card if you have them Contact information: [address] [place] [address] [phone]                 Signed: Eric Form , [facility] 11/26/2021, 2:41 PM Please see Amion for pager number during day hours 7:00am-4:30pm

## 2021-12-05 ENCOUNTER — Telehealth: Payer: Self-pay | Admitting: Gastroenterology

## 2021-12-05 NOTE — Telephone Encounter (Signed)
Urgent referral in [facility] from [facility] for bile leak (records in [facility]).  Requesting ERCP asap.  Dr. Chales Abrahams supervising MD 12/03/21.  Please advise scheduling

## 2021-12-05 NOTE — Telephone Encounter (Signed)
Received an Urgent Referral for Pt from [facility]: Requesting ERCP ASAP:  Providers schedules reviewed and it looks like there is no availability until October  for our providers to perform the ERCP: Can you please review Pt chart and advise if this is something that needs to wait this long: If so I can schedule pt for ERCP in October: Please advise

## 2021-12-07 NOTE — Telephone Encounter (Signed)
I have reviewed Dr. Lucilla Lame note.    Please set him up for a HIDA scan (suspected bile leak ASAP) If +, would mean that it has not resolved on its own, in that case-please set him up for ERCP with me ASAP for bile leak.  I am purple Doc. (also, if he needs to be admitted, that he needs to be admitted to hospitalist service).  Prefer [facility] for admission and ERCP. Please check CBC, CMP, lipase prior.  RG

## 2021-12-08 ENCOUNTER — Other Ambulatory Visit: Payer: Self-pay

## 2021-12-08 DIAGNOSIS — K839 Disease of biliary tract, unspecified: Secondary | ICD-10-CM

## 2021-12-08 NOTE — Telephone Encounter (Signed)
Pt made aware of Dr. Lyndel Safe recommendations: Pt scheduled for a HIDA scan 12/09/2021  at [facility] at 9:30: Pt to arrive at 9:00: Check in at Entrance C: Nothing to eat or drink 8 hours prior: No narcotics the morning of:  Pt made aware Pt verbalized understanding with all questions answered.

## 2021-12-09 ENCOUNTER — Other Ambulatory Visit: Payer: Self-pay

## 2021-12-09 ENCOUNTER — Other Ambulatory Visit: Payer: Self-pay | Admitting: Gastroenterology

## 2021-12-09 ENCOUNTER — Encounter (HOSPITAL_COMMUNITY)
Admission: RE | Admit: 2021-12-09 | Discharge: 2021-12-09 | Disposition: A | Discharge status: Home / Self Care | Payer: Medicare Other | Source: Ambulatory Visit | Attending: Gastroenterology | Admitting: Gastroenterology

## 2021-12-09 ENCOUNTER — Telehealth: Payer: Self-pay | Admitting: Gastroenterology

## 2021-12-09 DIAGNOSIS — R109 Unspecified abdominal pain: Secondary | ICD-10-CM

## 2021-12-09 DIAGNOSIS — K839 Disease of biliary tract, unspecified: Secondary | ICD-10-CM

## 2021-12-09 MED ORDER — TECHNETIUM TC 99M MEBROFENIN IV KIT
5.4000 | PACK | Freq: Once | INTRAVENOUS | Status: AC | PRN
  Administered 2021-12-09: 5.4 via INTRAVENOUS

## 2021-12-09 NOTE — Progress Notes (Signed)
Please inform the patient. HIDA -more consistent with enterogastric biliary reflux.  Less likely to be a leak. It appears that leak has resolved spontaneously as Dr. Cliffton Asters suspected.  Still to be on the safer side -Check CBC, CMP, lipase -CECT abdo in 1 week.  Earlier, if abdo pain -FU App clinic in 2- 3 weeks. Send report to family physician

## 2021-12-09 NOTE — Telephone Encounter (Signed)
Pt would like a call back advising his admission to [facility]?

## 2021-12-09 NOTE — Telephone Encounter (Signed)
Spoke with Pt. Documented in different note

## 2021-12-12 ENCOUNTER — Telehealth: Payer: Self-pay | Admitting: Gastroenterology

## 2021-12-12 ENCOUNTER — Ambulatory Visit (HOSPITAL_COMMUNITY)
Admission: RE | Admit: 2021-12-12 | Discharge: 2021-12-12 | Disposition: A | Discharge status: Home / Self Care | Payer: Medicare Other | Source: Ambulatory Visit | Attending: Gastroenterology | Admitting: Gastroenterology

## 2021-12-12 DIAGNOSIS — K839 Disease of biliary tract, unspecified: Secondary | ICD-10-CM | POA: Insufficient documentation

## 2021-12-12 DIAGNOSIS — R109 Unspecified abdominal pain: Secondary | ICD-10-CM

## 2021-12-12 MED ORDER — BARIUM SULFATE 0.1 % PO SUSP
ORAL | Status: AC
  Administered 2021-12-12: 1 mL
  Filled 2021-12-12: qty 2

## 2021-12-12 MED ORDER — BARIUM SULFATE 0.1 % PO SUSP
ORAL | Status: AC
  Administered 2021-12-12: 1 mL
  Filled 2021-12-12: qty 1

## 2021-12-12 MED ORDER — SODIUM CHLORIDE (PF) 0.9 % IJ SOLN
INTRAMUSCULAR | Status: AC
  Filled 2021-12-12: qty 50

## 2021-12-12 MED ORDER — IOHEXOL 300 MG/ML  SOLN
100.0000 mL | Freq: Once | INTRAMUSCULAR | Status: AC | PRN
  Administered 2021-12-12: 100 mL via INTRAVENOUS

## 2021-12-12 NOTE — Telephone Encounter (Signed)
Pt needs order of ct abdomen stat.

## 2021-12-12 NOTE — Telephone Encounter (Signed)
Spoke with Pt in regard to symptoms that he was having: Pt stated that he is having Right upper and lower abdominal pain, Loss of appetite;lost 14 lbs: Pt notified Dr. Caralyn Guile office: Pt stated that Dr. Caralyn Guile office is requesting  a STAT Ct scan:  Dr. Caralyn Guile office at [facility] contacted and spoke to Dr Caralyn Guile nurse Toniann Fail: Toniann Fail stated that Dr. Caralyn Guile office is requesting a stat CT scan: She stated that [facility] hospital has availability today. She stated that they faxed an order over but she stated that she thinks that it will be smother is we change the order on our end since the pt is already scheduled for the sept 7: Radiology called and time was changed for today at [facility] @ 12:00: Pt made aware: Pt verbalized understanding with all questions answered.

## 2021-12-13 ENCOUNTER — Telehealth: Payer: Self-pay | Admitting: Gastroenterology

## 2021-12-13 NOTE — Telephone Encounter (Signed)
I saw Dr. Carolynne Edouard this morning at [facility] Discussed with him.  I have called patient and patient's wife and discussed further course of action with both of them.  Patient had labs done yesterday.  All his labs are stable.  White cell count is 12 (slightly better than before), LFTs are normal except for mild elevation of AST.  No fever or chills.  JP drain still with bile but has slowed down considerably.  He underwent CT scan yesterday.  The results are pending.  I have given him various options including coming over to ED for Semi urgent ERCP this weekend.  He would like to wait until results of CT are available. Also hoping that, the bile leak would spontaneously close since it has slowed down considerably. He, certainly, would like to avoid any invasive procedures if possible.  Will again touch base tomorrow after CT results are back He will objectively measure the drain output.  All the questions were answered. I also discussed ERCP in detail including risks and benefits I spent over 30 min   Edman Circle, MD [facility] [phone]

## 2021-12-15 ENCOUNTER — Telehealth: Payer: Self-pay | Admitting: Gastroenterology

## 2021-12-15 NOTE — Telephone Encounter (Signed)
  We have discussed over the phone in detail  His JP drain output has decreased from 20 mL to 5 ml over 5 days No significant abdominal pain.  No fever or chills Reviewed CT with patient as well.  He would like to wait and avoid ERCP if possible.   He has appt with Dr. Carolynne Edouard on Friday 9/8  He knows that if he has any problems, he would come to ED right away.   Lum Babe.  He also has appointment with you on 9/18   We will also copy Dr. Russella Dar since he is purple Doc next week.  Thanks  RG

## 2021-12-18 ENCOUNTER — Other Ambulatory Visit (HOSPITAL_COMMUNITY): Payer: Medicare Other

## 2021-12-29 ENCOUNTER — Ambulatory Visit: Payer: Medicare Other | Admitting: Physician Assistant

## 2021-12-30 ENCOUNTER — Other Ambulatory Visit: Payer: Self-pay | Admitting: General Surgery

## 2021-12-30 ENCOUNTER — Other Ambulatory Visit (HOSPITAL_COMMUNITY): Payer: Self-pay | Admitting: General Surgery

## 2021-12-30 DIAGNOSIS — R1903 Right lower quadrant abdominal swelling, mass and lump: Secondary | ICD-10-CM

## 2021-12-30 DIAGNOSIS — R9389 Abnormal findings on diagnostic imaging of other specified body structures: Secondary | ICD-10-CM

## 2021-12-30 DIAGNOSIS — I700 Atherosclerosis of aorta: Secondary | ICD-10-CM

## 2022-01-21 ENCOUNTER — Ambulatory Visit (HOSPITAL_COMMUNITY)
Admission: RE | Admit: 2022-01-21 | Discharge: 2022-01-21 | Disposition: A | Discharge status: Home / Self Care | Payer: Medicare Other | Source: Ambulatory Visit | Attending: General Surgery | Admitting: General Surgery

## 2022-01-21 DIAGNOSIS — Z9049 Acquired absence of other specified parts of digestive tract: Secondary | ICD-10-CM | POA: Diagnosis not present

## 2022-01-21 DIAGNOSIS — R1903 Right lower quadrant abdominal swelling, mass and lump: Secondary | ICD-10-CM | POA: Insufficient documentation

## 2022-01-21 DIAGNOSIS — I700 Atherosclerosis of aorta: Secondary | ICD-10-CM | POA: Diagnosis not present

## 2022-01-21 DIAGNOSIS — R9389 Abnormal findings on diagnostic imaging of other specified body structures: Secondary | ICD-10-CM | POA: Insufficient documentation

## 2022-01-21 LAB — GLUCOSE, CAPILLARY: Glucose-Capillary: 98 mg/dL (ref 70–99)

## 2022-01-21 MED ORDER — FLUDEOXYGLUCOSE F - 18 (FDG) INJECTION
8.7500 | Freq: Once | INTRAVENOUS | Status: AC
  Administered 2022-01-21: 8.75 via INTRAVENOUS

## 2022-02-02 ENCOUNTER — Other Ambulatory Visit (HOSPITAL_COMMUNITY): Payer: Self-pay | Admitting: Family Medicine

## 2022-02-02 DIAGNOSIS — Z0000 Encounter for general adult medical examination without abnormal findings: Secondary | ICD-10-CM

## 2022-03-12 ENCOUNTER — Ambulatory Visit (HOSPITAL_COMMUNITY)
Admission: RE | Admit: 2022-03-12 | Discharge: 2022-03-12 | Disposition: A | Discharge status: Home / Self Care | Payer: Medicare Other | Source: Ambulatory Visit | Attending: Family Medicine | Admitting: Family Medicine

## 2022-03-12 DIAGNOSIS — Z0000 Encounter for general adult medical examination without abnormal findings: Secondary | ICD-10-CM | POA: Insufficient documentation

## 2022-03-20 NOTE — Progress Notes (Unsigned)
Referring-Charles Melinda Crutch, MD Reason for referral-thoracic aortic aneurysm  HPI: 66 year old male for evaluation of thoracic aortic aneurysm at request of Lawerance Cruel, MD.  Calcium score November 2023 32.2 which was 34th percentile; also noted to have 4.6 cm ascending aorta aneurysm; 3.3 mm right lower lobe nodule.  Current Outpatient Medications  Medication Sig Dispense Refill   acetaminophen (TYLENOL) 325 MG tablet Take 2 tablets (650 mg total) by mouth every 6 (six) hours as needed for mild pain or moderate pain (or temp > 100).     aspirin 81 MG tablet Take 160 mg by mouth daily.     B Complex-C (SUPER B COMPLEX PO) Take 1 capsule by mouth daily.      buPROPion (WELLBUTRIN SR) 200 MG 12 hr tablet Take 400 mg by mouth daily.   1   Calcium Citrate-Vitamin D (CITRACAL/VITAMIN D PO) Take 1 tablet by mouth daily.      colchicine 0.6 MG tablet Take 0.6 mg by mouth 2 (two) times daily.     docusate sodium (COLACE) 100 MG capsule Take 1 capsule (100 mg total) by mouth daily as needed for mild constipation or moderate constipation.     doxepin (SINEQUAN) 50 MG capsule Take 100 mg by mouth at bedtime.     Glucosamine-Chondroit-Vit C-Mn (GLUCOSAMINE 1500 COMPLEX PO) Take 2 tablets by mouth daily.      ibuprofen (ADVIL,MOTRIN) 200 MG tablet Take 200 mg by mouth every 6 (six) hours as needed for moderate pain.     levothyroxine (SYNTHROID) 100 MCG tablet Take 100 mcg by mouth every morning.     lithium carbonate 300 MG capsule Take 600 mg by mouth at bedtime.      meloxicam (MOBIC) 7.5 MG tablet Take 7.5 mg by mouth daily as needed for pain.     methocarbamol (ROBAXIN) 500 MG tablet Take 500 mg by mouth every 6 (six) hours as needed for muscle spasms.     moxifloxacin (VIGAMOX) 0.5 % ophthalmic solution Place 1 drop into the right eye in the morning and at bedtime.     Omega-3 Fatty Acids (FISH OIL) 1200 MG CAPS Take by mouth. 2 by mouth daily     ondansetron (ZOFRAN) 4 MG tablet Take 4  mg by mouth every 6 (six) hours as needed for nausea or vomiting.     tamsulosin (FLOMAX) 0.4 MG CAPS capsule Take 0.4 mg by mouth at bedtime.     vitamin C (ASCORBIC ACID) 500 MG tablet Take 500 mg by mouth daily.     No current facility-administered medications for this visit.    Allergies  Allergen Reactions   Penicillins Hives and Rash    Has patient had a PCN reaction causing immediate rash, facial/tongue/throat swelling, SOB or lightheadedness with hypotension: No Has patient had a PCN reaction causing severe rash involving mucus membranes or skin necrosis: No Has patient had a PCN reaction that required hospitalization: No Has patient had a PCN reaction occurring within the last 10 years: No If all of the above answers are "NO", then may proceed with Cephalosporin use.    Clindamycin/Lincomycin Nausea Only     Past Medical History:  Diagnosis Date   Complication of anesthesia    Depression    Family history of adverse reaction to anesthesia    mother gets N/V   Hyperthyroidism    S/P ablation   Pneumonia    PONV (postoperative nausea and vomiting)     Past Surgical  History:  Procedure Laterality Date   CHOLECYSTECTOMY N/A 11/24/2021   Procedure: LAPAROSCOPIC CHOLECYSTECTOMY WITH INTRAOPERATIVE CHOLANGIOGRAM;  Surgeon: Griselda Miner, MD;  Location: [facility];  Service: General;  Laterality: N/A;   COLONOSCOPY     EYE SURGERY     detached retina   INGUINAL HERNIA REPAIR Left 02/22/2020   Procedure: left inguinal hernia repair;  Surgeon: Emelia Loron, MD;  Location: [facility];  Service: General;  Laterality: Left;  gen and lma   RETINAL DETACHMENT SURGERY     OD   THYROIDECTOMY Right 02/28/2018   Procedure: RIGHT THYROIDECTOMY with frozen section;  Surgeon: Serena Colonel, MD;  Location: [facility] OR;  Service: ENT;  Laterality: Right;   TONSILLECTOMY AND ADENOIDECTOMY     UMBILICAL HERNIA REPAIR N/A 02/22/2020   Procedure: open umbilical hernia repair;   Surgeon: Emelia Loron, MD;  Location: [facility];  Service: General;  Laterality: N/A;  gen and lma   WISDOM TOOTH EXTRACTION      Social History   Socioeconomic History   Marital status: Married    Spouse name: Not on file   Number of children: Not on file   Years of education: Not on file   Highest education level: Not on file  Occupational History   Not on file  Tobacco Use   Smoking status: Never   Smokeless tobacco: Never  Vaping Use   Vaping Use: Never used  Substance and Sexual Activity   Alcohol use: No    Alcohol/week: 0.0 standard drinks of alcohol   Drug use: No   Sexual activity: Not on file  Other Topics Concern   Not on file  Social History Narrative   Not on file   Social Determinants of Health   Financial Resource Strain: Not on file  Food Insecurity: Not on file  Transportation Needs: Not on file  Physical Activity: Not on file  Stress: Not on file  Social Connections: Not on file  Intimate Partner Violence: Not on file    Family History  Problem Relation Age of Onset   Cancer Maternal Grandfather        colon   Aortic aneurysm Father        also thoracic    Deep vein thrombosis Father        thigh X 2   Cancer Maternal Uncle        1 with colon & 1 hepatic   Aneurysm Paternal Uncle        cns   Deep vein thrombosis Paternal Uncle        calf    ROS: no fevers or chills, productive cough, hemoptysis, dysphasia, odynophagia, melena, hematochezia, dysuria, hematuria, rash, seizure activity, orthopnea, PND, pedal edema, claudication. Remaining systems are negative.  Physical Exam:   There were no vitals taken for this visit.  General:  Well developed/well nourished in NAD Skin warm/dry Patient not depressed No peripheral clubbing Back-normal HEENT-normal/normal eyelids Neck supple/normal carotid upstroke bilaterally; no bruits; no JVD; no thyromegaly chest - CTA/ normal expansion CV - RRR/normal S1 and S2; no  murmurs, rubs or gallops;  PMI nondisplaced Abdomen -NT/ND, no HSM, no mass, + bowel sounds, no bruit 2+ femoral pulses, no bruits Ext-no edema, chords, 2+ DP Neuro-grossly nonfocal  ECG - personally reviewed  A/P  1 thoracic aortic aneurysm-we will arrange echocardiogram to rule out bicuspid aortic valve.  I will also arrange CTA to more fully assess.  Olga Millers, MD

## 2022-03-24 ENCOUNTER — Ambulatory Visit: Payer: Medicare Other | Attending: Cardiology | Admitting: Cardiology

## 2022-03-24 ENCOUNTER — Encounter: Payer: Self-pay | Admitting: Cardiology

## 2022-03-24 VITALS — BP 132/76 | HR 83 | Ht 69.0 in | Wt 187.0 lb

## 2022-03-24 DIAGNOSIS — I7121 Aneurysm of the ascending aorta, without rupture: Secondary | ICD-10-CM | POA: Insufficient documentation

## 2022-03-24 DIAGNOSIS — R0989 Other specified symptoms and signs involving the circulatory and respiratory systems: Secondary | ICD-10-CM | POA: Diagnosis not present

## 2022-03-24 DIAGNOSIS — I2510 Atherosclerotic heart disease of native coronary artery without angina pectoris: Secondary | ICD-10-CM | POA: Insufficient documentation

## 2022-03-24 DIAGNOSIS — E7800 Pure hypercholesterolemia, unspecified: Secondary | ICD-10-CM | POA: Insufficient documentation

## 2022-03-24 MED ORDER — ROSUVASTATIN CALCIUM 40 MG PO TABS: 40.0000 mg | ORAL_TABLET | Freq: Every day | ORAL | 3 refills | Status: DC

## 2022-03-24 NOTE — Patient Instructions (Signed)
Medication Instructions:   INCREASE ROSUVASTATIN TO 40 MG ONCE DAILY= 8 OF THE 5 MG TABLETS ONCE DAILY  *If you need a refill on your cardiac medications before your next appointment, please call your pharmacy*   Lab Work:  Your physician recommends that you return for lab work in: 8 [facility]  If you have labs (blood work) drawn today and your tests are completely normal, you will receive your results only by: MyChart Message (if you have MyChart) OR A paper copy in the mail If you have any lab test that is abnormal or we need to change your treatment, we will call you to review the results.   Testing/Procedures:  Your physician has requested that you have an echocardiogram. Echocardiography is a painless test that uses sound waves to create images of your heart. It provides your doctor with information about the size and shape of your heart and how well your heart's chambers and valves are working. This procedure takes approximately one hour. There are no restrictions for this procedure. Please do NOT wear cologne, perfume, aftershave, or lotions (deodorant is allowed). Please arrive 15 minutes prior to your appointment time. [address]   CTA OF THE CHEST TO FOLLOW UP ON THORACIC ANEURYSM AT [facility]-[address]   Follow-Up: At [facility], you and your health needs are our priority.  As part of our continuing mission to provide you with exceptional heart care, we have created designated Provider Care Teams.  These Care Teams include your primary Cardiologist (physician) and Advanced Practice Providers (APPs -  Physician Assistants and Nurse Practitioners) who all work together to provide you with the care you need, when you need it.  We recommend signing up for the patient portal called "MyChart".  Sign up information is provided on this After Visit Summary.  MyChart is used to connect with patients for Virtual Visits (Telemedicine).   Patients are able to view lab/test results, encounter notes, upcoming appointments, etc.  Non-urgent messages can be sent to your provider as well.   To learn more about what you can do with MyChart, go to [email or web address].    Your next appointment:   6 month(s)  The format for your next appointment:   In Person  Provider:   Olga Millers MD

## 2022-04-02 ENCOUNTER — Ambulatory Visit (HOSPITAL_COMMUNITY)
Admission: RE | Admit: 2022-04-02 | Payer: Medicare Other | Source: Ambulatory Visit | Attending: Cardiology | Admitting: Cardiology

## 2022-04-15 ENCOUNTER — Telehealth: Payer: Self-pay | Admitting: Cardiology

## 2022-04-15 ENCOUNTER — Encounter: Payer: Self-pay | Admitting: *Deleted

## 2022-04-15 NOTE — Telephone Encounter (Signed)
Patient is calling to talk with Dr. Crenshaw or nurse. Please call back 

## 2022-04-15 NOTE — Telephone Encounter (Signed)
Spoke with pt, after knee surgery, they have extended his PT for strength and balance. He is riding a bike, slider weight machine and body weight lifting and strength exercises. Because of his dilated aortic root, PT would like a clearance letter for him to be able to complete therapy. Will forward for dr Stanford Breed to review.

## 2022-04-15 NOTE — Telephone Encounter (Signed)
Letter generated and placed at the front desk for patient pick up. 

## 2022-04-20 ENCOUNTER — Encounter: Payer: Self-pay | Admitting: Cardiology

## 2022-04-22 ENCOUNTER — Ambulatory Visit (HOSPITAL_COMMUNITY): Payer: Medicare Other

## 2022-04-22 ENCOUNTER — Ambulatory Visit (HOSPITAL_COMMUNITY): Payer: Medicare Other | Attending: Internal Medicine

## 2022-04-22 DIAGNOSIS — I2510 Atherosclerotic heart disease of native coronary artery without angina pectoris: Secondary | ICD-10-CM

## 2022-04-22 LAB — ECHOCARDIOGRAM COMPLETE
Area-P 1/2: 2.81 cm2
Est EF: >75
S' Lateral: 2.5 cm

## 2022-04-27 ENCOUNTER — Encounter: Payer: Self-pay | Admitting: Cardiology

## 2022-04-27 MED ORDER — ATORVASTATIN CALCIUM 10 MG PO TABS: 10.0000 mg | ORAL_TABLET | Freq: Every day | ORAL | 11 refills | Status: DC

## 2022-04-29 ENCOUNTER — Ambulatory Visit
Admission: RE | Admit: 2022-04-29 | Discharge: 2022-04-29 | Disposition: A | Discharge status: Home / Self Care | Payer: Medicare Other | Source: Ambulatory Visit | Attending: Cardiology | Admitting: Cardiology

## 2022-04-29 DIAGNOSIS — I7121 Aneurysm of the ascending aorta, without rupture: Secondary | ICD-10-CM

## 2022-04-29 MED ORDER — IOPAMIDOL (ISOVUE-370) INJECTION 76%
75.0000 mL | Freq: Once | INTRAVENOUS | Status: AC | PRN
Start: 2022-04-29 — End: 2022-04-29
  Administered 2022-04-29: 75 mL via INTRAVENOUS

## 2022-04-30 ENCOUNTER — Other Ambulatory Visit: Payer: Self-pay | Admitting: General Surgery

## 2022-04-30 ENCOUNTER — Ambulatory Visit (HOSPITAL_COMMUNITY)
Admission: RE | Admit: 2022-04-30 | Discharge: 2022-04-30 | Disposition: A | Discharge status: Home / Self Care | Payer: Medicare Other | Source: Ambulatory Visit | Attending: Cardiology | Admitting: Cardiology

## 2022-04-30 DIAGNOSIS — R0989 Other specified symptoms and signs involving the circulatory and respiratory systems: Secondary | ICD-10-CM | POA: Diagnosis present

## 2022-04-30 DIAGNOSIS — R109 Unspecified abdominal pain: Secondary | ICD-10-CM

## 2022-06-02 ENCOUNTER — Ambulatory Visit
Admission: RE | Admit: 2022-06-02 | Discharge: 2022-06-02 | Disposition: A | Discharge status: Home / Self Care | Payer: Medicare Other | Source: Ambulatory Visit | Attending: General Surgery | Admitting: General Surgery

## 2022-06-02 DIAGNOSIS — R109 Unspecified abdominal pain: Secondary | ICD-10-CM

## 2022-06-02 MED ORDER — IOPAMIDOL (ISOVUE-300) INJECTION 61%
100.0000 mL | Freq: Once | INTRAVENOUS | Status: AC | PRN
  Administered 2022-06-02: 100 mL via INTRAVENOUS

## 2022-06-23 LAB — LIPID PANEL
Chol/HDL Ratio: 3.7 ratio (ref 0.0–5.0)
Cholesterol, Total: 154 mg/dL (ref 100–199)
HDL: 42 mg/dL (ref >39)
LDL Chol Calc (NIH): 71 mg/dL (ref 0–99)
Triglycerides: 251 mg/dL — ABNORMAL HIGH (ref 0–149)
VLDL Cholesterol Cal: 41 mg/dL — ABNORMAL HIGH (ref 5–40)

## 2022-06-23 LAB — HEPATIC FUNCTION PANEL
ALT: 35 IU/L (ref 0–44)
AST: 26 IU/L (ref 0–40)
Albumin: 4.4 g/dL (ref 3.9–4.9)
Alkaline Phosphatase: 96 IU/L (ref 44–121)
Bilirubin Total: 0.2 mg/dL (ref 0.0–1.2)
Bilirubin, Direct: <0.1 mg/dL (ref 0.00–0.40)
Total Protein: 6.6 g/dL (ref 6.0–8.5)

## 2022-07-03 ENCOUNTER — Telehealth: Payer: Self-pay | Admitting: Cardiology

## 2022-07-03 DIAGNOSIS — E7800 Pure hypercholesterolemia, unspecified: Secondary | ICD-10-CM

## 2022-07-03 MED ORDER — EZETIMIBE 10 MG PO TABS: 10.0000 mg | ORAL_TABLET | Freq: Every day | ORAL | 3 refills | Status: AC

## 2022-07-03 NOTE — Telephone Encounter (Signed)
Spoke with pt, Aware of dr Jacalyn Lefevre recommendations.  He agrees to try the zetia.  New script sent to the pharmacy Lab orders mailed to the pt   He also wanted to let us know he is having a biopsy at [place] due to a spot found next to his kidney.

## 2022-07-03 NOTE — Telephone Encounter (Signed)
Patient ask to change atorvastatin.  He was taking 10mg  and was told to increase to 40mg .  He only went up to 20mg  because he thought he could not handle the larger dose.  He states the 20mg  caused his legs to hurt so bad and has no balance because it is so bad.  He states he had same issues with Crestor and ask if another alternative.

## 2022-07-03 NOTE — Telephone Encounter (Signed)
Pt c/o medication issue:  1. Name of Medication:    2. How are you currently taking this medication (dosage and times per day)?    3. Are you having a reaction (difficulty breathing--STAT)? no  4. What is your medication issue? Calling to see if he can change his blood pressure meds. Please advise

## 2022-08-10 ENCOUNTER — Telehealth: Payer: Self-pay | Admitting: Cardiology

## 2022-08-10 NOTE — Telephone Encounter (Signed)
Patient's spouse stated the patient is having blood work is supposed to have blood work done on May 13th with Dr. Sherre Lain and would like to know if the patient could do his blood work that we need at the same time. Please advise.

## 2022-08-10 NOTE — Telephone Encounter (Signed)
Received call from patient and patients wife inquiring about lipid/lfts. Advised its not time yet for repeat labs due to recent medication changes. Advised to have those completed end of May. Patient verbalized understanding.

## 2022-08-12 NOTE — Progress Notes (Signed)
HPI: FU thoracic aortic aneurysm.  Calcium score November 2023 32.2 which was 34th percentile; also noted to have 4.6 cm ascending aorta aneurysm; 3.3 mm right lower lobe nodule.  Echocardiogram January 2024 showed normal LV function, mild left ventricular hypertrophy, grade 1 diastolic dysfunction, trace aortic insufficiency, mildly dilated aortic root at 42 mm and moderately dilated ascending aorta at 45 mm.  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  CTA January 2024 showed 4.5 cm ascending thoracic aortic aneurysm.  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  He has also developed myalgias with his Lipitor.  Prior to the last several weeks he was able to walk greater than 1 mile without having dyspnea or chest pain.  He has not had orthopnea, PND, pedal edema or syncope.  Current Outpatient Medications  Medication Sig Dispense Refill   aspirin 81 MG tablet Take 160 mg by mouth daily.     atorvastatin (LIPITOR) 10 MG tablet Take 1 tablet (10 mg total) by mouth daily. 30 tablet 11   buPROPion (WELLBUTRIN SR) 200 MG 12 hr tablet Take 400 mg by mouth daily.   1   colchicine 0.6 MG tablet Take 0.6 mg by mouth as needed.     docusate sodium (COLACE) 100 MG capsule Take 1 capsule (100 mg total) by mouth daily as needed for mild constipation or moderate constipation.     doxepin (SINEQUAN) 50 MG capsule Take 100 mg by mouth at bedtime.     ezetimibe (ZETIA) 10 MG tablet Take 1 tablet (10 mg total) by mouth daily. 90 tablet 3   Glucosamine-Chondroit-Vit C-Mn (GLUCOSAMINE 1500 COMPLEX PO) Take 1 tablet by mouth daily.     ibuprofen (ADVIL,MOTRIN) 200 MG tablet Take 200 mg by mouth every 6 (six) hours as needed for moderate pain.     levothyroxine (SYNTHROID) 100 MCG tablet Take 100 mcg by mouth every morning.     lithium  carbonate 300 MG capsule Take 600 mg by mouth at bedtime.      ondansetron (ZOFRAN) 4 MG tablet Take 4 mg by mouth every 6 (six) hours as needed for nausea or vomiting.     tamsulosin (FLOMAX) 0.4 MG CAPS capsule Take 0.4 mg by mouth at bedtime.     vitamin C (ASCORBIC ACID) 500 MG tablet Take 500 mg by mouth daily.     No current facility-administered medications for this visit.     Past Medical History:  Diagnosis Date   Complication of anesthesia    Depression    Family history of adverse reaction to anesthesia    mother gets N/V   Hyperthyroidism    S/P ablation   Pneumonia    PONV (postoperative nausea and vomiting)     Past Surgical History:  Procedure Laterality Date   CHOLECYSTECTOMY N/A 11/24/2021   Procedure: LAPAROSCOPIC CHOLECYSTECTOMY WITH INTRAOPERATIVE CHOLANGIOGRAM;  Surgeon: Griselda Miner, MD;  Location: [facility];  Service: General;  Laterality: N/A;   COLONOSCOPY     EYE SURGERY     detached retina   INGUINAL HERNIA REPAIR Left 02/22/2020   Procedure: left inguinal hernia repair;  Surgeon: Emelia Loron, MD;  Location: [facility];  Service: General;  Laterality: Left;  gen and lma   RETINAL DETACHMENT SURGERY     OD   THYROIDECTOMY Right 02/28/2018  Procedure: RIGHT THYROIDECTOMY with frozen section;  Surgeon: Serena Colonel, MD;  Location: [facility];  Service: ENT;  Laterality: Right;   TONSILLECTOMY AND ADENOIDECTOMY     UMBILICAL HERNIA REPAIR N/A 02/22/2020   Procedure: open umbilical hernia repair;  Surgeon: Emelia Loron, MD;  Location: [facility];  Service: General;  Laterality: N/A;  gen and lma   WISDOM TOOTH EXTRACTION      Social History   Socioeconomic History   Marital status: Married    Spouse name: Not on file   Number of children: Not on file   Years of education: Not on file   Highest education level: Not on file  Occupational History   Not on file  Tobacco Use   Smoking status: Never   Smokeless  tobacco: Never  Vaping Use   Vaping Use: Never used  Substance and Sexual Activity   Alcohol use: No    Alcohol/week: 0.0 standard drinks of alcohol   Drug use: No   Sexual activity: Not on file  Other Topics Concern   Not on file  Social History Narrative   Not on file   Social Determinants of Health   Financial Resource Strain: Not on file  Food Insecurity: Not on file  Transportation Needs: Not on file  Physical Activity: Not on file  Stress: Not on file  Social Connections: Not on file  Intimate Partner Violence: Not on file    Family History  Problem Relation Age of Onset   Aortic aneurysm Father        also thoracic    Deep vein thrombosis Father        thigh X 2   Cancer Maternal Grandfather        colon   Cancer Maternal Uncle        1 with colon & 1 hepatic   Aneurysm Paternal Uncle        cns   Deep vein thrombosis Paternal Uncle        calf    ROS: no fevers or chills, productive cough, hemoptysis, dysphasia, odynophagia, melena, hematochezia, dysuria, hematuria, rash, seizure activity, orthopnea, PND, pedal edema, claudication. Remaining systems are negative.  Physical Exam: Well-developed well-nourished in no acute distress.  Skin is warm and dry.  HEENT is normal.  Neck is supple.  Chest is clear to auscultation with normal expansion.  Cardiovascular exam is regular rate and rhythm.  Abdominal exam nontender or distended. No masses palpated. Extremities show no edema. neuro grossly intact  A/P  1 thoracic aortic aneurysm-plan follow-up CTA January 2025.  This will also reimage lung nodule.  2 coronary calcification-continue aspirin.  Discontinue statin due to myalgias.  3 hyperlipidemia-patient complains of myalgias.  Discontinue Lipitor.  If his symptoms improved we will consider PCSK9 inhibitor at next office visit.  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  5 preoperative evaluation prior to resection of carcinoma  in his abdomen-patient was able to walk greater than 1 mile with no chest pain or dyspnea.  He may proceed without further cardiac evaluation.  Olga Millers, MD

## 2022-08-13 ENCOUNTER — Encounter: Payer: Self-pay | Admitting: Cardiology

## 2022-08-13 ENCOUNTER — Ambulatory Visit: Payer: Medicare Other | Attending: Cardiology | Admitting: Cardiology

## 2022-08-13 VITALS — BP 132/76 | HR 86 | Ht 69.0 in | Wt 188.0 lb

## 2022-08-13 DIAGNOSIS — I2510 Atherosclerotic heart disease of native coronary artery without angina pectoris: Secondary | ICD-10-CM | POA: Diagnosis not present

## 2022-08-13 DIAGNOSIS — I7121 Aneurysm of the ascending aorta, without rupture: Secondary | ICD-10-CM | POA: Insufficient documentation

## 2022-08-13 DIAGNOSIS — E7800 Pure hypercholesterolemia, unspecified: Secondary | ICD-10-CM | POA: Insufficient documentation

## 2022-08-13 NOTE — Patient Instructions (Signed)
STOP ATORVASTATIN   Follow-Up: At [facility], you and your health needs are our priority.  As part of our continuing mission to provide you with exceptional heart care, we have created designated Provider Care Teams.  These Care Teams include your primary Cardiologist (physician) and Advanced Practice Providers (APPs -  Physician Assistants and Nurse Practitioners) who all work together to provide you with the care you need, when you need it.  We recommend signing up for the patient portal called "MyChart".  Sign up information is provided on this After Visit Summary.  MyChart is used to connect with patients for Virtual Visits (Telemedicine).  Patients are able to view lab/test results, encounter notes, upcoming appointments, etc.  Non-urgent messages can be sent to your provider as well.   To learn more about what you can do with MyChart, go to [email or web address].    Your next appointment:   3 month(s)  Provider:   Olga Millers, MD

## 2022-08-21 ENCOUNTER — Emergency Department (HOSPITAL_BASED_OUTPATIENT_CLINIC_OR_DEPARTMENT_OTHER)
Admission: EM | Admit: 2022-08-21 | Discharge: 2022-08-21 | Disposition: A | Discharge status: Home / Self Care | Payer: Medicare Other | Attending: Emergency Medicine | Admitting: Emergency Medicine

## 2022-08-21 ENCOUNTER — Encounter (HOSPITAL_BASED_OUTPATIENT_CLINIC_OR_DEPARTMENT_OTHER): Payer: Self-pay | Admitting: *Deleted

## 2022-08-21 ENCOUNTER — Other Ambulatory Visit: Payer: Self-pay

## 2022-08-21 ENCOUNTER — Emergency Department (HOSPITAL_BASED_OUTPATIENT_CLINIC_OR_DEPARTMENT_OTHER): Payer: Medicare Other

## 2022-08-21 DIAGNOSIS — Z7982 Long term (current) use of aspirin: Secondary | ICD-10-CM | POA: Insufficient documentation

## 2022-08-21 DIAGNOSIS — R519 Headache, unspecified: Secondary | ICD-10-CM | POA: Insufficient documentation

## 2022-08-21 HISTORY — DX: Malignant (primary) neoplasm, unspecified: C80.1

## 2022-08-21 LAB — CBC WITH DIFFERENTIAL/PLATELET
Abs Immature Granulocytes: 0.04 10*3/uL (ref 0.00–0.07)
Basophils Absolute: 0 10*3/uL (ref 0.0–0.1)
Basophils Relative: 0 %
Eosinophils Absolute: 0.1 10*3/uL (ref 0.0–0.5)
Eosinophils Relative: 1 %
HCT: 45.7 % (ref 39.0–52.0)
Hemoglobin: 15 g/dL (ref 13.0–17.0)
Immature Granulocytes: 0 %
Lymphocytes Relative: 10 %
Lymphs Abs: 1 10*3/uL (ref 0.7–4.0)
MCH: 29.7 pg (ref 26.0–34.0)
MCHC: 32.8 g/dL (ref 30.0–36.0)
MCV: 90.5 fL (ref 80.0–100.0)
Monocytes Absolute: 0.6 10*3/uL (ref 0.1–1.0)
Monocytes Relative: 5 %
Neutro Abs: 8.6 10*3/uL — ABNORMAL HIGH (ref 1.7–7.7)
Neutrophils Relative %: 84 %
Platelets: 235 10*3/uL (ref 150–400)
RBC: 5.05 MIL/uL (ref 4.22–5.81)
RDW: 13 % (ref 11.5–15.5)
WBC: 10.4 10*3/uL (ref 4.0–10.5)
nRBC: 0 % (ref 0.0–0.2)

## 2022-08-21 LAB — BASIC METABOLIC PANEL
Anion gap: 8 (ref 5–15)
BUN: 16 mg/dL (ref 8–23)
CO2: 26 mmol/L (ref 22–32)
Calcium: 9.3 mg/dL (ref 8.9–10.3)
Chloride: 105 mmol/L (ref 98–111)
Creatinine, Ser: 1.1 mg/dL (ref 0.61–1.24)
GFR, Estimated: >60 mL/min (ref >60)
Glucose, Bld: 111 mg/dL — ABNORMAL HIGH (ref 70–99)
Potassium: 4 mmol/L (ref 3.5–5.1)
Sodium: 139 mmol/L (ref 135–145)

## 2022-08-21 MED ORDER — PROCHLORPERAZINE EDISYLATE 10 MG/2ML IJ SOLN
10.0000 mg | Freq: Once | INTRAMUSCULAR | Status: AC
  Administered 2022-08-21: 10 mg via INTRAVENOUS
  Filled 2022-08-21: qty 2

## 2022-08-21 MED ORDER — DEXAMETHASONE SODIUM PHOSPHATE 10 MG/ML IJ SOLN
10.0000 mg | Freq: Once | INTRAMUSCULAR | Status: AC
  Administered 2022-08-21: 10 mg via INTRAVENOUS
  Filled 2022-08-21: qty 1

## 2022-08-21 MED ORDER — DIPHENHYDRAMINE HCL 50 MG/ML IJ SOLN
25.0000 mg | Freq: Once | INTRAMUSCULAR | Status: AC
  Administered 2022-08-21: 25 mg via INTRAVENOUS
  Filled 2022-08-21: qty 1

## 2022-08-21 MED ORDER — MAGNESIUM SULFATE 2 GM/50ML IV SOLN
2.0000 g | Freq: Once | INTRAVENOUS | Status: AC
  Administered 2022-08-21: 2 g via INTRAVENOUS
  Filled 2022-08-21: qty 50

## 2022-08-21 MED ORDER — SODIUM CHLORIDE 0.9 % IV BOLUS
1000.0000 mL | Freq: Once | INTRAVENOUS | Status: AC
  Administered 2022-08-21: 1000 mL via INTRAVENOUS

## 2022-08-21 MED ORDER — KETOROLAC TROMETHAMINE 30 MG/ML IJ SOLN
15.0000 mg | Freq: Once | INTRAMUSCULAR | Status: AC
  Administered 2022-08-21: 15 mg via INTRAVENOUS
  Filled 2022-08-21: qty 1

## 2022-08-21 MED ORDER — DIAZEPAM 5 MG PO TABS: 5.0000 mg | ORAL_TABLET | Freq: Three times a day (TID) | ORAL | 0 refills | Status: AC | PRN

## 2022-08-21 MED ORDER — DIAZEPAM 5 MG/ML IJ SOLN
5.0000 mg | Freq: Once | INTRAMUSCULAR | Status: AC
  Administered 2022-08-21: 5 mg via INTRAVENOUS
  Filled 2022-08-21: qty 2

## 2022-08-21 NOTE — ED Triage Notes (Signed)
Severe headache x1 week with pain in left front.

## 2022-08-21 NOTE — ED Provider Notes (Signed)
[place] EMERGENCY DEPARTMENT AT [facility] Provider Note   CSN: [id number] Arrival date & time: 08/21/22  1241     History  Chief Complaint  Patient presents with   Headache    Patrick Webster. is a 67 y.o. male.  Patient here with left-sided headache for the last week or so.  Worse over the last few days.  Has had somewhat of a mild headache the last 5 or 6 days and now a little bit more intense last 2 days.  Headache gradual in onset.  Lights bothering him.  He had previous eye surgery to the right eye.  He is currently undergoing planning to remove a tumor in his kidney.  No PET scan was done.  Sounds like mostly localized renal tumor they suspect.  He denies any rash or fever or chills.  Has been sleeping well.  He describes the pain mostly in the left frontal forehead down to his left side of his face.  He has no neck pain.  No numbness or weakness or tingling or stroke symptoms or facial weakness or vision changes.  The history is provided by the patient.       Home Medications Prior to Admission medications   Medication Sig Start Date End Date Taking? Authorizing Provider  aspirin 81 MG tablet Take 160 mg by mouth daily.    [provider]  buPROPion (WELLBUTRIN SR) 200 MG 12 hr tablet Take 400 mg by mouth daily.  01/22/18   [provider]  colchicine 0.6 MG tablet Take 0.6 mg by mouth as needed. 11/11/21   [provider]  docusate sodium (COLACE) 100 MG capsule Take 1 capsule (100 mg total) by mouth daily as needed for mild constipation or moderate constipation. 11/26/21   Eric Form, PA-C  doxepin (SINEQUAN) 50 MG capsule Take 100 mg by mouth at bedtime. 10/23/21   [provider]  ezetimibe (ZETIA) 10 MG tablet Take 1 tablet (10 mg total) by mouth daily. 07/03/22   Lewayne Bunting, MD  Glucosamine-Chondroit-Vit C-Mn (GLUCOSAMINE 1500 COMPLEX PO) Take 1 tablet by mouth daily.    [provider]  ibuprofen  (ADVIL,MOTRIN) 200 MG tablet Take 200 mg by mouth every 6 (six) hours as needed for moderate pain.    [provider]  levothyroxine (SYNTHROID) 100 MCG tablet Take 100 mcg by mouth every morning. 10/16/21   [provider]  lithium carbonate 300 MG capsule Take 600 mg by mouth at bedtime.     [provider]  ondansetron (ZOFRAN) 4 MG tablet Take 4 mg by mouth every 6 (six) hours as needed for nausea or vomiting. 10/28/21   [provider]  tamsulosin (FLOMAX) 0.4 MG CAPS capsule Take 0.4 mg by mouth at bedtime. 10/16/21   [provider]  vitamin C (ASCORBIC ACID) 500 MG tablet Take 500 mg by mouth daily.    [provider]      Allergies    Penicillins, Clindamycin/lincomycin, and Compazine [prochlorperazine]    Review of Systems   Review of Systems  Physical Exam Updated Vital Signs BP (!) 156/90 (BP Location: Left Arm)   Pulse 76   Temp 97.6 F (36.4 C) (Oral)   Resp 18   SpO2 98%  Physical Exam Vitals and nursing note reviewed.  Constitutional:      General: He is not in acute distress.    Appearance: He is well-developed. He is not ill-appearing.  HENT:  Head: Normocephalic and atraumatic.     Nose: Nose normal.     Mouth/Throat:     Mouth: Mucous membranes are moist.  Eyes:     Conjunctiva/sclera: Conjunctivae normal.     Comments: Surgical pupil of the right eye, reactive pupil to light on the left  Cardiovascular:     Rate and Rhythm: Normal rate and regular rhythm.     Pulses: Normal pulses.     Heart sounds: Normal heart sounds. No murmur heard. Pulmonary:     Effort: Pulmonary effort is normal. No respiratory distress.     Breath sounds: Normal breath sounds.  Abdominal:     Palpations: Abdomen is soft.     Tenderness: There is no abdominal tenderness.  Musculoskeletal:        General: No swelling. Normal range of motion.     Cervical back: Normal range of motion and neck supple.  Skin:    General: Skin  is warm and dry.     Capillary Refill: Capillary refill takes less than 2 seconds.  Neurological:     General: No focal deficit present.     Mental Status: He is alert and oriented to person, place, and time.     Cranial Nerves: No cranial nerve deficit.     Sensory: No sensory deficit.     Motor: No weakness.     Coordination: Coordination normal.     Gait: Gait normal.     Comments: 5+ out of 5 strength throughout, normal sensation, no drift, normal finger-nose-finger, normal speech  Psychiatric:        Mood and Affect: Mood normal.     ED Results / Procedures / Treatments   Labs (all labs ordered are listed, but only abnormal results are displayed) Labs Reviewed  CBC WITH DIFFERENTIAL/PLATELET - Abnormal; Notable for the following components:      Result Value   Neutro Abs 8.6 (*)    All other components within normal limits  BASIC METABOLIC PANEL - Abnormal; Notable for the following components:   Glucose, Bld 111 (*)    All other components within normal limits    EKG None  Radiology No results found.  Procedures Procedures    Medications Ordered in ED Medications  sodium chloride 0.9 % bolus 1,000 mL (1,000 mLs Intravenous New Bag/Given 08/21/22 1317)  prochlorperazine (COMPAZINE) injection 10 mg (10 mg Intravenous Given 08/21/22 1311)  diphenhydrAMINE (BENADRYL) injection 25 mg (25 mg Intravenous Given 08/21/22 1311)  magnesium sulfate IVPB 2 g 50 mL (0 g Intravenous Stopped 08/21/22 1440)  ketorolac (TORADOL) 30 MG/ML injection 15 mg (15 mg Intravenous Given 08/21/22 1440)  dexamethasone (DECADRON) injection 10 mg (10 mg Intravenous Given 08/21/22 1423)  diphenhydrAMINE (BENADRYL) injection 25 mg (25 mg Intravenous Given 08/21/22 1424)    ED Course/ Medical Decision Making/ A&P                             Medical Decision Making Amount and/or Complexity of Data Reviewed Labs: ordered. Radiology: ordered.  Risk Prescription drug management.   Marvell Fuller. is here with headaches.  Normal vitals.  No fever.  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  Neurologically he appears to be intact.  No acute weakness or numbness or symptoms concerning for stroke.  He describes the left frontal headache mostly in his  left side of his forehead down to the left side of his face.  He is currently being worked up for kidney cancer and this was have surgery next month.  PET scan was not done.  Ultimately we will get a CT scan to look for any big brain mass.  Seems unlikely to be a brain bleed.  Seems like this could be migraine/tension process but may be even trigeminal neuralgia.  I do not see a rash and I do not suspect shingles.  Will give headache cocktail with IV Compazine and Benadryl and get a head CT.  Per my review and interpretation of labs no significant anemia or electrolyte abnormality or kidney injury or leukocytosis.  Head CT is unremarkable per my review and interpretation as well as radiology's report.  He still not feeling quite better after first round headache medicine.  Sounds like he had a little bit of a dystonic reaction from the Compazine. Will give an additional dose IV benadryl, IV Toradol and IV Decadron and IV magnesium.  Patient handed off to oncoming ED staff.  I suspect that this is a migraine type headache.  This could be a trigeminal neuralgia.  I will have any concern for temporal arteritis or infectious process.  There is no obvious mass seen on head CT.  I do not have any concern for sinus thrombosis given history and physical as well.  Please see oncoming ED providers note for further results, evaluation, disposition of the patient.  This chart was dictated using voice recognition software.  Despite best efforts to proofread,  errors can occur which can change the documentation meaning.         Final Clinical Impression(s) / ED  Diagnoses Final diagnoses:  Nonintractable headache, unspecified chronicity pattern, unspecified headache type    Rx / DC Orders ED Discharge Orders     None         Virgina Norfolk, DO 08/21/22 1450

## 2022-08-21 NOTE — ED Provider Notes (Signed)
Care of patient received from prior provider at 3:17 PM, please see their note for complete H/P and care plan.  Received handoff per ED course.  Clinical Course as of 08/21/22 1517  Fri Aug 21, 2022  1514 Stable  Severe headache 66yom with headache Being treated. Reassess. [CC]    Clinical Course User Index [CC] Glyn Ade, MD    Reassessment: Reassessed on patient handoff.  His symptoms are no better after extensive therapy from Dr. Lockie Mola. Had a long conversation with the patient.  His symptoms are very sharp episodic shooting pain in his left superior face. He states that the onset like a thunderbolt last approximately 2 minutes and then completely resolved for 5 to 10 minutes before recurring.  Medications to date have not helped with this therapy.  No history of similar.  He denies fevers or chills, nausea vomiting, syncope shortness of breath.  Denies any visual symptoms and denies any temporal bone tenderness.  My differential for this patient is very broad includes meningitis encephalitis which seem less likely based on lack of fevers chills and meningismus It includes intracranial mass or intracranial hemorrhage.  Intracranial mass is considered more likely based on his recent cancer diagnosis but the location and description of symptoms is atypical.  Lack of morning headaches and relatively rapid onset of the symptoms also appears to be more consistent with alternative etiology.  CT head by Dr. Lockie Mola did not find of the large intracranial mass because of the severe headache. Atypical migraine remains on the differential but lack of response to typical therapies also makes this diagnosis less likely.  Age and recent cancer diagnosis increase my pretest probability for giant cell arteritis but lack of visual symptoms or temporal tenderness would decrease this diagnosis is likelihood as well.  Most likely differential diagnosis is trigeminal neuralgia given his description of  sharp shooting episodic pain. While this is a diagnosis of exclusion, will try treatment with neuropathic medication such as a benzodiazepine and plan for reassessment. Reassessment: After 4 additional hours of observation patient has had gross improvement of all of his symptoms after administration of benzodiazepine.  He has no longer having episodes of shooting pain.  He feels comfortable outpatient follow-up with neurology.  I discussed residual diagnostic uncertainty regarding intracranial mass or hemorrhage and that if his symptoms were to be persistent or recur he might warrant immediate neurologic consultation and would need to proceed to the tertiary care centers. This patient expressed understanding but felt comfortable with outpatient care and management at this time.  Referred to neurology in the outpatient setting for further diagnostic care management will trial outpatient Valium as needed and I discussed the risk of utilizing this medication including risk of falls and avoidance of operation of heavy machinery/vehicles.  Disposition:  I have considered need for hospitalization, however, considering all of the above, I believe this patient is stable for discharge at this time.  Patient/family educated about specific return precautions for given chief complaint and symptoms.  Patient/family educated about follow-up with PCP and neurology.     Patient/family expressed understanding of return precautions and need for follow-up. Patient spoken to regarding all imaging and laboratory results and appropriate follow up for these results. All education provided in verbal form with additional information in written form. Time was allowed for answering of patient questions. Patient discharged.    Emergency Department Medication Summary:   Medications  sodium chloride 0.9 % bolus 1,000 mL (0 mLs Intravenous Stopped 08/21/22 1513)  prochlorperazine (COMPAZINE) injection 10 mg (10 mg Intravenous  Given 08/21/22 1311)  diphenhydrAMINE (BENADRYL) injection 25 mg (25 mg Intravenous Given 08/21/22 1311)  magnesium sulfate IVPB 2 g 50 mL (0 g Intravenous Stopped 08/21/22 1440)  ketorolac (TORADOL) 30 MG/ML injection 15 mg (15 mg Intravenous Given 08/21/22 1440)  dexamethasone (DECADRON) injection 10 mg (10 mg Intravenous Given 08/21/22 1423)  diphenhydrAMINE (BENADRYL) injection 25 mg (25 mg Intravenous Given 08/21/22 1424)  diazepam (VALIUM) injection 5 mg (5 mg Intravenous Given 08/21/22 1628)          Glyn Ade, MD 08/21/22 1610

## 2022-08-23 ENCOUNTER — Emergency Department (HOSPITAL_COMMUNITY): Payer: Medicare Other

## 2022-08-23 ENCOUNTER — Emergency Department (HOSPITAL_COMMUNITY)
Admission: EM | Admit: 2022-08-23 | Discharge: 2022-08-23 | Disposition: A | Discharge status: Home / Self Care | Payer: Medicare Other | Attending: Emergency Medicine | Admitting: Emergency Medicine

## 2022-08-23 ENCOUNTER — Encounter (HOSPITAL_COMMUNITY): Payer: Self-pay | Admitting: Pharmacy Technician

## 2022-08-23 ENCOUNTER — Other Ambulatory Visit: Payer: Self-pay

## 2022-08-23 DIAGNOSIS — E039 Hypothyroidism, unspecified: Secondary | ICD-10-CM | POA: Insufficient documentation

## 2022-08-23 DIAGNOSIS — R519 Headache, unspecified: Secondary | ICD-10-CM | POA: Diagnosis present

## 2022-08-23 DIAGNOSIS — I2510 Atherosclerotic heart disease of native coronary artery without angina pectoris: Secondary | ICD-10-CM | POA: Diagnosis not present

## 2022-08-23 DIAGNOSIS — Z7982 Long term (current) use of aspirin: Secondary | ICD-10-CM | POA: Diagnosis not present

## 2022-08-23 DIAGNOSIS — Z79899 Other long term (current) drug therapy: Secondary | ICD-10-CM | POA: Diagnosis not present

## 2022-08-23 DIAGNOSIS — B029 Zoster without complications: Secondary | ICD-10-CM

## 2022-08-23 LAB — CBC WITH DIFFERENTIAL/PLATELET
Abs Immature Granulocytes: 0.03 10*3/uL (ref 0.00–0.07)
Basophils Absolute: 0.1 10*3/uL (ref 0.0–0.1)
Basophils Relative: 1 %
Eosinophils Absolute: 0 10*3/uL (ref 0.0–0.5)
Eosinophils Relative: 0 %
HCT: 47.2 % (ref 39.0–52.0)
Hemoglobin: 15.4 g/dL (ref 13.0–17.0)
Immature Granulocytes: 0 %
Lymphocytes Relative: 10 %
Lymphs Abs: 1 10*3/uL (ref 0.7–4.0)
MCH: 29.4 pg (ref 26.0–34.0)
MCHC: 32.6 g/dL (ref 30.0–36.0)
MCV: 90.2 fL (ref 80.0–100.0)
Monocytes Absolute: 0.7 10*3/uL (ref 0.1–1.0)
Monocytes Relative: 7 %
Neutro Abs: 8.3 10*3/uL — ABNORMAL HIGH (ref 1.7–7.7)
Neutrophils Relative %: 82 %
Platelets: 262 10*3/uL (ref 150–400)
RBC: 5.23 MIL/uL (ref 4.22–5.81)
RDW: 12.6 % (ref 11.5–15.5)
WBC: 10.1 10*3/uL (ref 4.0–10.5)
nRBC: 0 % (ref 0.0–0.2)

## 2022-08-23 LAB — COMPREHENSIVE METABOLIC PANEL
ALT: 35 U/L (ref 0–44)
AST: 29 U/L (ref 15–41)
Albumin: 4.4 g/dL (ref 3.5–5.0)
Alkaline Phosphatase: 75 U/L (ref 38–126)
Anion gap: 10 (ref 5–15)
BUN: 14 mg/dL (ref 8–23)
CO2: 22 mmol/L (ref 22–32)
Calcium: 9.3 mg/dL (ref 8.9–10.3)
Chloride: 103 mmol/L (ref 98–111)
Creatinine, Ser: 1.16 mg/dL (ref 0.61–1.24)
GFR, Estimated: >60 mL/min (ref >60)
Glucose, Bld: 110 mg/dL — ABNORMAL HIGH (ref 70–99)
Potassium: 3.6 mmol/L (ref 3.5–5.1)
Sodium: 135 mmol/L (ref 135–145)
Total Bilirubin: 0.5 mg/dL (ref 0.3–1.2)
Total Protein: 7.1 g/dL (ref 6.5–8.1)

## 2022-08-23 MED ORDER — LACTATED RINGERS IV BOLUS
1000.0000 mL | Freq: Once | INTRAVENOUS | Status: AC
  Administered 2022-08-23: 1000 mL via INTRAVENOUS

## 2022-08-23 MED ORDER — GABAPENTIN 100 MG PO CAPS
100.0000 mg | ORAL_CAPSULE | Freq: Once | ORAL | Status: AC
  Administered 2022-08-23: 100 mg via ORAL
  Filled 2022-08-23: qty 1

## 2022-08-23 MED ORDER — VALACYCLOVIR HCL 500 MG PO TABS
1000.0000 mg | ORAL_TABLET | Freq: Once | ORAL | Status: AC
  Administered 2022-08-23: 1000 mg via ORAL
  Filled 2022-08-23: qty 2

## 2022-08-23 MED ORDER — DIPHENHYDRAMINE HCL 50 MG/ML IJ SOLN
25.0000 mg | Freq: Once | INTRAMUSCULAR | Status: AC
  Administered 2022-08-23: 25 mg via INTRAVENOUS
  Filled 2022-08-23: qty 1

## 2022-08-23 MED ORDER — FLUORESCEIN SODIUM 1 MG OP STRP
1.0000 | ORAL_STRIP | Freq: Once | OPHTHALMIC | Status: AC
  Administered 2022-08-23: 1 via OPHTHALMIC
  Filled 2022-08-23: qty 1

## 2022-08-23 MED ORDER — LACTATED RINGERS IV BOLUS
500.0000 mL | Freq: Once | INTRAVENOUS | Status: AC
  Administered 2022-08-23: 500 mL via INTRAVENOUS

## 2022-08-23 MED ORDER — IOHEXOL 350 MG/ML SOLN
75.0000 mL | Freq: Once | INTRAVENOUS | Status: AC | PRN
  Administered 2022-08-23: 75 mL via INTRAVENOUS

## 2022-08-23 MED ORDER — TETRACAINE HCL 0.5 % OP SOLN
1.0000 [drp] | Freq: Once | OPHTHALMIC | Status: AC
  Administered 2022-08-23: 1 [drp] via OPHTHALMIC
  Filled 2022-08-23: qty 4

## 2022-08-23 MED ORDER — IOHEXOL 350 MG/ML SOLN
50.0000 mL | Freq: Once | INTRAVENOUS | Status: AC | PRN
  Administered 2022-08-23: 50 mL via INTRAVENOUS

## 2022-08-23 MED ORDER — METOCLOPRAMIDE HCL 5 MG/ML IJ SOLN
10.0000 mg | Freq: Once | INTRAMUSCULAR | Status: AC
  Administered 2022-08-23: 10 mg via INTRAVENOUS
  Filled 2022-08-23: qty 2

## 2022-08-23 MED ORDER — VALACYCLOVIR HCL 1 G PO TABS: 1000.0000 mg | ORAL_TABLET | Freq: Three times a day (TID) | ORAL | 0 refills | Status: AC

## 2022-08-23 MED ORDER — GABAPENTIN 100 MG PO CAPS: 100.0000 mg | ORAL_CAPSULE | Freq: Three times a day (TID) | ORAL | 1 refills | Status: AC

## 2022-08-23 NOTE — ED Notes (Signed)
Severe heaDFaChe for 3 days with nausea and vomiting  he was sent here by [facility] today  for treatment vomiting on arrival to the room

## 2022-08-23 NOTE — Discharge Instructions (Addendum)
You were seen today for headache and rash.  Your CT scans and lab work are reassuring.  Your symptoms are due to shingles which is a viral infection.  You are being discharged on pain medication and antiviral medication.  You should not drive while taking the gabapentin as it may make you drowsy, do not mix this with alcohol or the previously prescribed Valium.  You should follow-up with your doctor later this week for reevaluation.  If you develop severe pain that is not improving, weakness, numbness, eye pain or vision changes you should return to the ED.

## 2022-08-23 NOTE — ED Triage Notes (Signed)
Pt here with headaches X1 week. Seen several days ago for same. Endorses nausea.

## 2022-08-23 NOTE — ED Provider Notes (Signed)
[place] EMERGENCY DEPARTMENT AT [facility] Provider Note   CSN: [id number] Arrival date & time: 08/23/22  1535     History  Chief Complaint  Patient presents with   Headache    Patrick Webster. is a 67 y.o. male.  67 year old male history of recent diagnosed liposarcoma of the retroperitoneum, ascending aortic aneurysm, CAD, hyperlipidemia,, hypothyroidism presenting for headache.  Patient is here with his wife.  States symptoms started last week.  Has had paroxysms of severe sharp pain over the left side of his forehead and behind his left eye.  He was seen here 2 days ago and had a noncontrast CT head which was unremarkable as well as CBC and BMP which were unremarkable.  He was given Valium and headache cocktail without significant improvement.  Pain has Persisted.  [place] and had worsening pain which is now somewhat more posterior on his head so they came back here for evaluation.  Patient has severe now constant sharp pain over his left forehead and back of his head.  He reports mild rash to the forehead which he attributes to an ice pack.  He has not had any weakness or numbness or vision changes.  He had prior surgery in his right eye so has a chronically dilated pupil.  He has not had any trauma, fevers, chills, neck stiffness.   Headache      Home Medications Prior to Admission medications   Medication Sig Start Date End Date Taking? Authorizing Provider  gabapentin (NEURONTIN) 100 MG capsule Take 1 capsule (100 mg total) by mouth 3 (three) times daily. 08/23/22  Yes Fulton Reek, MD  valACYclovir (VALTREX) 1000 MG tablet Take 1 tablet (1,000 mg total) by mouth 3 (three) times daily for 10 days. 08/23/22 09/02/22 Yes Fulton Reek, MD  aspirin 81 MG tablet Take 160 mg by mouth daily.    [provider]  buPROPion (WELLBUTRIN SR) 200 MG 12 hr tablet Take 400 mg by mouth daily.  01/22/18   [provider]   colchicine 0.6 MG tablet Take 0.6 mg by mouth as needed. 11/11/21   [provider]  diazepam (VALIUM) 5 MG tablet Take 1 tablet (5 mg total) by mouth every 8 (eight) hours as needed for anxiety. 08/21/22   Glyn Ade, MD  docusate sodium (COLACE) 100 MG capsule Take 1 capsule (100 mg total) by mouth daily as needed for mild constipation or moderate constipation. 11/26/21   Eric Form, PA-C  doxepin (SINEQUAN) 50 MG capsule Take 100 mg by mouth at bedtime. 10/23/21   [provider]  ezetimibe (ZETIA) 10 MG tablet Take 1 tablet (10 mg total) by mouth daily. 07/03/22   Lewayne Bunting, MD  Glucosamine-Chondroit-Vit C-Mn (GLUCOSAMINE 1500 COMPLEX PO) Take 1 tablet by mouth daily.    [provider]  ibuprofen (ADVIL,MOTRIN) 200 MG tablet Take 200 mg by mouth every 6 (six) hours as needed for moderate pain.    [provider]  levothyroxine (SYNTHROID) 100 MCG tablet Take 100 mcg by mouth every morning. 10/16/21   [provider]  lithium carbonate 300 MG capsule Take 600 mg by mouth at bedtime.     [provider]  ondansetron (ZOFRAN) 4 MG tablet Take 4 mg by mouth every 6 (six) hours as needed for nausea or vomiting. 10/28/21   [provider]  tamsulosin (FLOMAX) 0.4 MG CAPS capsule Take 0.4 mg by mouth at bedtime. 10/16/21  [provider]  vitamin C (ASCORBIC ACID) 500 MG tablet Take 500 mg by mouth daily.    [provider]      Allergies    Penicillins, Clindamycin/lincomycin, and Compazine [prochlorperazine]    Review of Systems   Review of Systems  Skin:  Positive for rash.  Neurological:  Positive for headaches.  All other systems reviewed and are negative.   Physical Exam Updated Vital Signs BP (!) 165/103   Pulse 76   Temp 98.2 F (36.8 C) (Oral)   Resp 18   SpO2 97%  Physical Exam Vitals and nursing note reviewed.  Constitutional:      General: He is in acute distress.  HENT:      Head: Normocephalic and atraumatic.  Eyes:     Extraocular Movements: Extraocular movements intact.     Conjunctiva/sclera: Conjunctivae normal.     Comments: Right pupil is chronically dilated postsurgery.  Visual fields are intact.  Left pupil is reactive, no gross abnormalities to the left eye.  He has subtle vesicular lesion which is tender over the left V1 distribution.  No involvement of the ear.  Cardiovascular:     Rate and Rhythm: Normal rate and regular rhythm.     Heart sounds: No murmur heard. Pulmonary:     Effort: Pulmonary effort is normal. No respiratory distress.     Breath sounds: Normal breath sounds.  Abdominal:     Palpations: Abdomen is soft.     Tenderness: There is no abdominal tenderness.  Musculoskeletal:        General: No swelling.     Cervical back: Normal range of motion and neck supple. No rigidity.  Lymphadenopathy:     Cervical: No cervical adenopathy.  Skin:    General: Skin is warm and dry.     Capillary Refill: Capillary refill takes less than 2 seconds.  Neurological:     Mental Status: He is alert and oriented to person, place, and time.     GCS: GCS eye subscore is 4. GCS verbal subscore is 5. GCS motor subscore is 6.     Cranial Nerves: No cranial nerve deficit, dysarthria or facial asymmetry.     Sensory: No sensory deficit.     Motor: No weakness.     Coordination: Coordination normal.     Deep Tendon Reflexes: Reflexes normal.  Psychiatric:        Mood and Affect: Mood normal.     ED Results / Procedures / Treatments   Labs (all labs ordered are listed, but only abnormal results are displayed) Labs Reviewed  CBC WITH DIFFERENTIAL/PLATELET - Abnormal; Notable for the following components:      Result Value   Neutro Abs 8.3 (*)    All other components within normal limits  COMPREHENSIVE METABOLIC PANEL - Abnormal; Notable for the following components:   Glucose, Bld 110 (*)    All other components within normal limits     EKG None  Radiology CT VENOGRAM HEAD  Result Date: 08/23/2022 CLINICAL DATA:  Sudden onset severe headache EXAM: CT VENOGRAM HEAD TECHNIQUE: Venographic phase images of the brain were obtained following the administration of intravenous contrast. Multiplanar reformats and maximum intensity projections were generated. RADIATION DOSE REDUCTION: This exam was performed according to the departmental dose-optimization program which includes automated exposure control, adjustment of the mA and/or kV according to patient size and/or use of iterative reconstruction technique. CONTRAST:  50mL OMNIPAQUE IOHEXOL 350 MG/ML SOLN COMPARISON:  None Available. FINDINGS:  Superior sagittal sinus: Normal. Straight sinus: Normal. Inferior sagittal sinus, vein of Galen and internal cerebral veins: Normal. Transverse sinuses: Normal. Sigmoid sinuses: Normal. Visualized jugular veins: Normal. IMPRESSION: No evidence of dural venous sinus thrombosis. Electronically Signed   By: Deatra Robinson M.D.   On: 08/23/2022 21:13   CT ANGIO HEAD NECK W WO CM  Result Date: 08/23/2022 CLINICAL DATA:  Sudden onset severe headache EXAM: CT ANGIOGRAPHY HEAD AND NECK WITH AND WITHOUT CONTRAST TECHNIQUE: Multidetector CT imaging of the head and neck was performed using the standard protocol during bolus administration of intravenous contrast. Multiplanar CT image reconstructions and MIPs were obtained to evaluate the vascular anatomy. Carotid stenosis measurements (when applicable) are obtained utilizing NASCET criteria, using the distal internal carotid diameter as the denominator. RADIATION DOSE REDUCTION: This exam was performed according to the departmental dose-optimization program which includes automated exposure control, adjustment of the mA and/or kV according to patient size and/or use of iterative reconstruction technique. CONTRAST:  75mL OMNIPAQUE IOHEXOL 350 MG/ML SOLN COMPARISON:  Head CT 08/21/2022 FINDINGS: CTA NECK FINDINGS  SKELETON: There is no bony spinal canal stenosis. No lytic or blastic lesion. OTHER NECK: Enlarged heterogeneous left lobe of the thyroid. UPPER CHEST: No pneumothorax or pleural effusion. No nodules or masses. AORTIC ARCH: There is no calcific atherosclerosis of the aortic arch. There is no aneurysm, dissection or hemodynamically significant stenosis of the visualized portion of the aorta. Conventional 3 vessel aortic branching pattern. The visualized proximal subclavian arteries are widely patent. RIGHT CAROTID SYSTEM: Normal without aneurysm, dissection or stenosis. LEFT CAROTID SYSTEM: Normal without aneurysm, dissection or stenosis. VERTEBRAL ARTERIES: Left dominant configuration. Both origins are clearly patent. There is no dissection, occlusion or flow-limiting stenosis to the skull base (V1-V3 segments). CTA HEAD FINDINGS POSTERIOR CIRCULATION: --Vertebral arteries: Normal V4 segments. --Inferior cerebellar arteries: Normal. --Basilar artery: Normal. --Superior cerebellar arteries: Normal. --Posterior cerebral arteries (PCA): Normal. ANTERIOR CIRCULATION: --Intracranial internal carotid arteries: Normal. --Anterior cerebral arteries (ACA): Normal. Both A1 segments are present. Patent anterior communicating artery (a-comm). --Middle cerebral arteries (MCA): Normal. VENOUS SINUSES: As permitted by contrast timing, patent. ANATOMIC VARIANTS: Fetal origins of both posterior cerebral arteries. Review of the MIP images confirms the above findings. IMPRESSION: 1. No emergent large vessel occlusion or high-grade stenosis of the intracranial arteries. 2. Enlarged heterogeneous left lobe of the thyroid. This has been previously assessed with ultrasound Electronically Signed   By: Deatra Robinson M.D.   On: 08/23/2022 19:38    Procedures Procedures    Medications Ordered in ED Medications  fluorescein ophthalmic strip 1 strip (1 strip Both Eyes Given 08/23/22 1732)  tetracaine (PONTOCAINE) 0.5 % ophthalmic  solution 1 drop (1 drop Both Eyes Given 08/23/22 1732)  metoCLOPramide (REGLAN) injection 10 mg (10 mg Intravenous Given 08/23/22 1718)  diphenhydrAMINE (BENADRYL) injection 25 mg (25 mg Intravenous Given 08/23/22 1717)  lactated ringers bolus 1,000 mL (0 mLs Intravenous Stopped 08/23/22 1811)  gabapentin (NEURONTIN) capsule 100 mg (100 mg Oral Given 08/23/22 1732)  lactated ringers bolus 500 mL (0 mLs Intravenous Stopped 08/23/22 1838)  iohexol (OMNIPAQUE) 350 MG/ML injection 75 mL (75 mLs Intravenous Contrast Given 08/23/22 1915)  iohexol (OMNIPAQUE) 350 MG/ML injection 50 mL (50 mLs Intravenous Contrast Given 08/23/22 1944)  gabapentin (NEURONTIN) capsule 100 mg (100 mg Oral Given 08/23/22 2111)  valACYclovir (VALTREX) tablet 1,000 mg (1,000 mg Oral Given 08/23/22 2156)    ED Course/ Medical Decision Making/ A&P Clinical Course as of 08/23/22 2334  Sun Aug 23, 2022  1738 Fluorescein staining performed bilaterally.  No focal uptake or signs of corneal injury. [JD]    Clinical Course User Index [JD] Fulton Reek, MD                             Medical Decision Making Amount and/or Complexity of Data Reviewed Labs: ordered. Radiology: ordered.  Risk Prescription drug management.   67 year old male presenting for severe headache.  Vital signs reviewed notable for hypertension.  On exam he is in acute distress from severe pain.  He has no to focal deficits on exam, low concern for stroke.  He had recent noncontrast CT head which was unremarkable.  He has vesicular rash concerning for shingles of the V1 distribution which I think is likely causing his pain.  However given severity of his symptoms will obtain CT angiogram to look for aneurysm as well as CT venogram to rule out venous sinus thrombosis as cause for his pain.  Will treat symptomatically as well.  CTA head and neck as well as CT venogram both unremarkable without evidence of aneurysm, bleeding, thrombosis.  I performed fluorescein  staining on bilateral eyes, he has no corneal uptake or of evidence of ocular involvement.  I reviewed his lab work, his CBC and CMP are unremarkable.  He has significant response to gabapentin, improvement in his headache.  His presentation consistent with shingles of the V1 distribution.  He has no evidence of ocular involvement.  I think he is appropriate for outpatient management.  He was given a prescription for gabapentin, instructed not to drink alcohol or combine with any other sedating medications or drive while taking this.  Also given prescription for valacyclovir.  I recommend he follow-up closely with his primary care provider within the next week.  I gave him strict return precautions for worsening pain, any eye pain or vision changes, or any other concerns.  He was discharged in stable condition.        Final Clinical Impression(s) / ED Diagnoses Final diagnoses:  Herpes zoster without complication    Rx / DC Orders ED Discharge Orders          Ordered    gabapentin (NEURONTIN) 100 MG capsule  3 times daily        08/23/22 2139    valACYclovir (VALTREX) 1000 MG tablet  3 times daily        08/23/22 2139              Fulton Reek, MD 08/23/22 2334    Tegeler, Canary Brim, MD 08/24/22 [phone]

## 2022-10-09 ENCOUNTER — Ambulatory Visit: Payer: Medicare Other | Admitting: Cardiology

## 2022-11-03 NOTE — Progress Notes (Signed)
HPI: FU thoracic aortic aneurysm.  Calcium score November 2023 32.2 which was 34th percentile; also noted to have 4.6 cm ascending aorta aneurysm; 3.3 mm right lower lobe nodule.  Echocardiogram January 2024 showed normal LV function, mild left ventricular hypertrophy, grade 1 diastolic dysfunction, trace aortic insufficiency, mildly dilated aortic root at 42 mm and moderately dilated ascending aorta at 45 mm.  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  CTA January 2024 showed 4.5 cm ascending thoracic aortic aneurysm.  Since last seen, he denies dyspnea, chest pain, palpitations or syncope.  Current Outpatient Medications  Medication Sig Dispense Refill   aspirin 81 MG tablet Take 160 mg by mouth daily.     buPROPion (WELLBUTRIN SR) 200 MG 12 hr tablet Take 400 mg by mouth daily.   1   colchicine 0.6 MG tablet Take 0.6 mg by mouth as needed.     docusate sodium (COLACE) 100 MG capsule Take 1 capsule (100 mg total) by mouth daily as needed for mild constipation or moderate constipation.     doxepin (SINEQUAN) 50 MG capsule Take 100 mg by mouth at bedtime.     levothyroxine (SYNTHROID) 100 MCG tablet Take 100 mcg by mouth every morning.     lithium carbonate 300 MG capsule Take 600 mg by mouth at bedtime.      tamsulosin (FLOMAX) 0.4 MG CAPS capsule Take 0.4 mg by mouth at bedtime.     diazepam (VALIUM) 5 MG tablet Take 1 tablet (5 mg total) by mouth every 8 (eight) hours as needed for anxiety. (Patient not taking: Reported on 11/17/2022) 20 tablet 0   ezetimibe (ZETIA) 10 MG tablet Take 1 tablet (10 mg total) by mouth daily. (Patient not taking: Reported on 11/17/2022) 90 tablet 3   gabapentin (NEURONTIN) 100 MG capsule Take 1 capsule (100 mg total) by mouth 3 (three) times daily. (Patient not taking: Reported on 11/17/2022) 30 capsule 1   Glucosamine-Chondroit-Vit C-Mn (GLUCOSAMINE 1500 COMPLEX PO) Take  1 tablet by mouth daily. (Patient not taking: Reported on 11/17/2022)     ibuprofen (ADVIL,MOTRIN) 200 MG tablet Take 200 mg by mouth every 6 (six) hours as needed for moderate pain. (Patient not taking: Reported on 11/17/2022)     ondansetron (ZOFRAN) 4 MG tablet Take 4 mg by mouth every 6 (six) hours as needed for nausea or vomiting. (Patient not taking: Reported on 11/17/2022)     vitamin C (ASCORBIC ACID) 500 MG tablet Take 500 mg by mouth daily. (Patient not taking: Reported on 11/17/2022)     No current facility-administered medications for this visit.     Past Medical History:  Diagnosis Date   Carcinoma (HCC)    kidney   Complication of anesthesia    Depression    Family history of adverse reaction to anesthesia    mother gets N/V   Hyperthyroidism    S/P ablation   Pneumonia    PONV (postoperative nausea and vomiting)     Past Surgical History:  Procedure Laterality Date   CHOLECYSTECTOMY N/A 11/24/2021   Procedure: LAPAROSCOPIC CHOLECYSTECTOMY WITH INTRAOPERATIVE CHOLANGIOGRAM;  Surgeon: Griselda Miner, MD;  Location: [facility];  Service: General;  Laterality: N/A;   COLONOSCOPY     EYE SURGERY     detached retina   INGUINAL HERNIA REPAIR Left 02/22/2020   Procedure: left inguinal hernia repair;  Surgeon: Emelia Loron, MD;  Location: [facility];  Service: General;  Laterality: Left;  gen and lma   RETINAL DETACHMENT SURGERY     OD   THYROIDECTOMY Right 02/28/2018   Procedure: RIGHT THYROIDECTOMY with frozen section;  Surgeon: Serena Colonel, MD;  Location: [facility] OR;  Service: ENT;  Laterality: Right;   TONSILLECTOMY AND ADENOIDECTOMY     UMBILICAL HERNIA REPAIR N/A 02/22/2020   Procedure: open umbilical hernia repair;  Surgeon: Emelia Loron, MD;  Location: [facility];  Service: General;  Laterality: N/A;  gen and lma   WISDOM TOOTH EXTRACTION      Social History   Socioeconomic History   Marital status: Married    Spouse name: Not on  file   Number of children: Not on file   Years of education: Not on file   Highest education level: Not on file  Occupational History   Not on file  Tobacco Use   Smoking status: Never   Smokeless tobacco: Never  Vaping Use   Vaping status: Never Used  Substance and Sexual Activity   Alcohol use: No    Alcohol/week: 0.0 standard drinks of alcohol   Drug use: No   Sexual activity: Not on file  Other Topics Concern   Not on file  Social History Narrative   Not on file   Social Determinants of Health   Financial Resource Strain: Not on file  Food Insecurity: Not on file  Transportation Needs: Not on file  Physical Activity: Not on file  Stress: Not on file  Social Connections: Not on file  Intimate Partner Violence: Not on file    Family History  Problem Relation Age of Onset   Aortic aneurysm Father        also thoracic    Deep vein thrombosis Father        thigh X 2   Cancer Maternal Grandfather        colon   Cancer Maternal Uncle        1 with colon & 1 hepatic   Aneurysm Paternal Uncle        cns   Deep vein thrombosis Paternal Uncle        calf    ROS: no fevers or chills, productive cough, hemoptysis, dysphasia, odynophagia, melena, hematochezia, dysuria, hematuria, rash, seizure activity, orthopnea, PND, pedal edema, claudication. Remaining systems are negative.  Physical Exam: Well-developed well-nourished in no acute distress.  Skin is warm and dry.  HEENT is normal.  Neck is supple.  Chest is clear to auscultation with normal expansion.  Cardiovascular exam is regular rate and rhythm.  Abdominal exam nontender or distended. No masses palpated. Extremities show no edema. neuro grossly intact   A/P  1 thoracic aortic aneurysm-patient will need follow-up imaging January 2025.  His renal function has deteriorated following his recent nephrectomy.  I would like to avoid IV contrast if possible.  Will consider MRA.  Hopefully this could also image  previous lung nodule.  2 coronary calcification-continue aspirin.  Statin discontinued due to myalgias.  3 hyperlipidemia-statin discontinued secondary to myalgias.  Will recheck lipids.  If LDL not at goal we will consider PCSK9 inhibitor or Zetia.  4 dilated iliac artery-patient will need follow-up ultrasound in January 2025.  Olga Millers, MD

## 2022-11-17 ENCOUNTER — Encounter: Payer: Self-pay | Admitting: Cardiology

## 2022-11-17 ENCOUNTER — Ambulatory Visit: Payer: Medicare Other | Admitting: Cardiology

## 2022-11-17 VITALS — BP 132/78 | HR 94 | Ht 69.0 in | Wt 186.2 lb

## 2022-11-17 DIAGNOSIS — I2510 Atherosclerotic heart disease of native coronary artery without angina pectoris: Secondary | ICD-10-CM | POA: Diagnosis present

## 2022-11-17 DIAGNOSIS — I7121 Aneurysm of the ascending aorta, without rupture: Secondary | ICD-10-CM | POA: Diagnosis present

## 2022-11-17 DIAGNOSIS — E7800 Pure hypercholesterolemia, unspecified: Secondary | ICD-10-CM | POA: Insufficient documentation

## 2022-11-17 NOTE — Patient Instructions (Signed)
    Lab Work:  Your physician recommends that you return for lab work LIPID PROFILE  If you have labs (blood work) drawn today and your tests are completely normal, you will receive your results only by: MyChart Message (if you have MyChart) OR A paper copy in the mail If you have any lab test that is abnormal or we need to change your treatment, we will call you to review the results.   Follow-Up: At [facility], you and your health needs are our priority.  As part of our continuing mission to provide you with exceptional heart care, we have created designated Provider Care Teams.  These Care Teams include your primary Cardiologist (physician) and Advanced Practice Providers (APPs -  Physician Assistants and Nurse Practitioners) who all work together to provide you with the care you need, when you need it.  We recommend signing up for the patient portal called "MyChart".  Sign up information is provided on this After Visit Summary.  MyChart is used to connect with patients for Virtual Visits (Telemedicine).  Patients are able to view lab/test results, encounter notes, upcoming appointments, etc.  Non-urgent messages can be sent to your provider as well.   To learn more about what you can do with MyChart, go to [email or web address].    Your next appointment:   6 month(s)  Provider:  Olga Millers, MD

## 2022-11-19 ENCOUNTER — Other Ambulatory Visit: Payer: Self-pay | Admitting: *Deleted

## 2022-11-19 DIAGNOSIS — E7800 Pure hypercholesterolemia, unspecified: Secondary | ICD-10-CM

## 2023-01-18 ENCOUNTER — Ambulatory Visit
Admission: RE | Admit: 2023-01-18 | Discharge: 2023-01-18 | Disposition: A | Discharge status: Home / Self Care | Payer: Medicare Other | Source: Ambulatory Visit | Attending: Family Medicine | Admitting: Family Medicine

## 2023-01-18 ENCOUNTER — Other Ambulatory Visit: Payer: Self-pay | Admitting: Family Medicine

## 2023-01-18 DIAGNOSIS — J189 Pneumonia, unspecified organism: Secondary | ICD-10-CM

## 2023-01-21 ENCOUNTER — Other Ambulatory Visit: Payer: Self-pay | Admitting: Family Medicine

## 2023-01-21 DIAGNOSIS — R9389 Abnormal findings on diagnostic imaging of other specified body structures: Secondary | ICD-10-CM

## 2023-01-22 ENCOUNTER — Ambulatory Visit
Admission: RE | Admit: 2023-01-22 | Discharge: 2023-01-22 | Disposition: A | Discharge status: Home / Self Care | Payer: Medicare Other | Source: Ambulatory Visit | Attending: Family Medicine | Admitting: Family Medicine

## 2023-01-22 DIAGNOSIS — R9389 Abnormal findings on diagnostic imaging of other specified body structures: Secondary | ICD-10-CM

## 2023-03-23 ENCOUNTER — Other Ambulatory Visit: Payer: Self-pay | Admitting: *Deleted

## 2023-03-23 DIAGNOSIS — I7141 Pararenal abdominal aortic aneurysm, without rupture: Secondary | ICD-10-CM

## 2023-03-23 DIAGNOSIS — I7121 Aneurysm of the ascending aorta, without rupture: Secondary | ICD-10-CM

## 2023-04-08 ENCOUNTER — Telehealth: Payer: Self-pay | Admitting: *Deleted

## 2023-04-08 NOTE — Telephone Encounter (Signed)
Left message for pt to call, he had a CT scan in October by Dr Tenny Craw and his thoracic aneurysm has not changed. We will repat that in one year. Also, a thyroid US was recommended due to nodule seen on CT scan. ? If thyroid US was done?

## 2023-04-09 NOTE — Telephone Encounter (Signed)
Spoke with pt, he has not had a thyroid US. He does report he has had part of his thyroid removed. He does see Dr Talmage Nap for endocrinology. Aware will forward to Dr Talmage Nap for her review and to help decide if further testing is needed. Pt agreed with this plan.

## 2023-04-09 NOTE — Telephone Encounter (Signed)
Pt returning call

## 2023-05-14 ENCOUNTER — Ambulatory Visit (HOSPITAL_COMMUNITY)
Admission: RE | Admit: 2023-05-14 | Discharge: 2023-05-14 | Disposition: A | Discharge status: Home / Self Care | Payer: Medicare Other | Source: Ambulatory Visit | Attending: Cardiology | Admitting: Cardiology

## 2023-05-14 DIAGNOSIS — I7141 Pararenal abdominal aortic aneurysm, without rupture: Secondary | ICD-10-CM | POA: Insufficient documentation

## 2023-05-17 ENCOUNTER — Encounter: Payer: Self-pay | Admitting: *Deleted

## 2023-05-17 ENCOUNTER — Other Ambulatory Visit: Payer: Self-pay | Admitting: *Deleted

## 2023-05-17 DIAGNOSIS — I7121 Aneurysm of the ascending aorta, without rupture: Secondary | ICD-10-CM

## 2023-05-24 ENCOUNTER — Encounter: Payer: Self-pay | Admitting: *Deleted

## 2023-06-08 ENCOUNTER — Other Ambulatory Visit: Payer: Self-pay | Admitting: Cardiology

## 2023-06-11 ENCOUNTER — Telehealth: Payer: Self-pay | Admitting: Cardiology

## 2023-06-11 NOTE — Telephone Encounter (Signed)
 Pt would like to speak to a triage nurse

## 2023-06-11 NOTE — Telephone Encounter (Signed)
 Patient came in and wanting a sample of eliquis. He is a [facility] patient. He normally gets Its through pharmacy but they quoted him $500 and stated it was to expensive and would like to get an sample.

## 2023-06-11 NOTE — Telephone Encounter (Signed)
 Patient identification verified by 2 forms. Shade Flood, RN     Called and spoke to patient  Patient states:  - Prescription for eliquis was originally sent by [facility] but dr Clarnce Flock was supposed to follow it.  - Patient to stop taking the eliquis next month but refill is $500 and he can not afford that.              Interventions/Plan: - Prescription is not listed on patient current or prior med list. Unable to verify prescription. - Recommended patient request samples from Dr. Tenny Craw and I will leave 30-day free card at front desk for patient.     Patient agrees with plan, no questions at this time

## 2023-06-14 NOTE — Telephone Encounter (Signed)
 Spoke with pt, samples of eliquis 5 mg placed at the front desk for pick up.

## 2023-06-14 NOTE — Telephone Encounter (Signed)
 Pt called in asking to speak with you about this note.

## 2023-06-23 ENCOUNTER — Ambulatory Visit (HOSPITAL_COMMUNITY)
Admission: RE | Admit: 2023-06-23 | Discharge: 2023-06-23 | Disposition: A | Discharge status: Home / Self Care | Source: Ambulatory Visit | Attending: Vascular Surgery | Admitting: Vascular Surgery

## 2023-06-23 ENCOUNTER — Other Ambulatory Visit (HOSPITAL_COMMUNITY): Payer: Self-pay | Admitting: Family Medicine

## 2023-06-23 DIAGNOSIS — M79604 Pain in right leg: Secondary | ICD-10-CM | POA: Diagnosis present

## 2023-07-05 ENCOUNTER — Ambulatory Visit: Payer: Medicare Other | Admitting: Internal Medicine

## 2023-08-16 ENCOUNTER — Ambulatory Visit (AMBULATORY_SURGERY_CENTER): Admitting: *Deleted

## 2023-08-16 VITALS — Ht 69.0 in | Wt 190.0 lb

## 2023-08-16 DIAGNOSIS — Z1211 Encounter for screening for malignant neoplasm of colon: Secondary | ICD-10-CM

## 2023-08-16 MED ORDER — NA SULFATE-K SULFATE-MG SULF 17.5-3.13-1.6 GM/177ML PO SOLN: 1.0000 | Freq: Once | ORAL | 0 refills | Status: AC

## 2023-08-16 NOTE — Progress Notes (Signed)
  Pre visit completed over telephone. Instructions forwarded through MyChart and secure email.  No egg or soy allergy known to patient  No issues known to pt with past sedation with any surgeries or procedures Patient denies ever being told they had issues or difficulty with intubation  No FH of Malignant Hyperthermia Pt is not on diet pills Pt is not on  home 02  Pt is not on blood thinners  Pt denies issues with constipation  No A fib or A flutter Have any cardiac testing pending-- no Pt instructed to use [email or web address] or GoodRx for a price reduction on prep

## 2023-08-31 ENCOUNTER — Encounter: Admitting: Internal Medicine

## 2023-09-15 ENCOUNTER — Telehealth: Payer: Self-pay | Admitting: Gastroenterology

## 2023-09-15 NOTE — Telephone Encounter (Signed)
 Returned patient call and discussed prep.

## 2023-09-15 NOTE — Telephone Encounter (Signed)
 Patient called and state that he was needing to speak to the nurse regarding his prep medication and his instruction. Patient stated that he only has one kidney left and was wondering if his prep medication interferes with kidney function. Patient also stated that he would like to over his instruction with the nurse. Please advise.

## 2023-09-21 ENCOUNTER — Encounter: Payer: Self-pay | Admitting: Internal Medicine

## 2023-09-21 ENCOUNTER — Ambulatory Visit: Admitting: Internal Medicine

## 2023-09-21 VITALS — BP 112/76 | HR 73 | Temp 98.1°F | Resp 18 | Ht 69.0 in | Wt 190.0 lb

## 2023-09-21 DIAGNOSIS — D120 Benign neoplasm of cecum: Secondary | ICD-10-CM | POA: Diagnosis not present

## 2023-09-21 DIAGNOSIS — Z1211 Encounter for screening for malignant neoplasm of colon: Secondary | ICD-10-CM

## 2023-09-21 MED ORDER — SODIUM CHLORIDE 0.9 % IV SOLN: 500.0000 mL | Freq: Once | INTRAVENOUS | Status: DC

## 2023-09-21 NOTE — Progress Notes (Signed)
 Called to room to assist during endoscopic procedure.  Patient ID and intended procedure confirmed with present staff. Received instructions for my participation in the procedure from the performing physician.

## 2023-09-21 NOTE — Progress Notes (Signed)
 HISTORY OF PRESENT ILLNESS:  Patrick Saintjean. is a 68 y.o. male sent for screening colonoscopy.  No complaints  REVIEW OF SYSTEMS:  All non-GI ROS negative except for  Past Medical History:  Diagnosis Date   Carcinoma (HCC)    kidney   Complication of anesthesia    Depression    Family history of adverse reaction to anesthesia    mother gets N/V   Hyperthyroidism    S/P ablation   Pneumonia    PONV (postoperative nausea and vomiting)     Past Surgical History:  Procedure Laterality Date   CHOLECYSTECTOMY N/A 11/24/2021   Procedure: LAPAROSCOPIC CHOLECYSTECTOMY WITH INTRAOPERATIVE CHOLANGIOGRAM;  Surgeon: Caralyn Chandler, MD;  Location: [facility];  Service: General;  Laterality: N/A;   COLONOSCOPY     EYE SURGERY     detached retina   INGUINAL HERNIA REPAIR Left 02/22/2020   Procedure: left inguinal hernia repair;  Surgeon: Enid Harry, MD;  Location: [facility];  Service: General;  Laterality: Left;  gen and lma   RETINAL DETACHMENT SURGERY     OD   REVISION TOTAL KNEE ARTHROPLASTY Right 2023   THYROIDECTOMY Right 02/28/2018   Procedure: RIGHT THYROIDECTOMY with frozen section;  Surgeon: Janita Mellow, MD;  Location: [facility] OR;  Service: ENT;  Laterality: Right;   TONSILLECTOMY AND ADENOIDECTOMY     UMBILICAL HERNIA REPAIR N/A 02/22/2020   Procedure: open umbilical hernia repair;  Surgeon: Enid Harry, MD;  Location: [facility];  Service: General;  Laterality: N/A;  gen and lma   WISDOM TOOTH EXTRACTION      Social History Patrick Barber.  reports that he has never smoked. He has never used smokeless tobacco. He reports that he does not drink alcohol and does not use drugs.  family history includes Aneurysm in his paternal uncle; Aortic aneurysm in his father; Cancer in his maternal grandfather and maternal uncle; Colon polyps in his paternal grandmother; Deep vein thrombosis in his father and paternal uncle.  Allergies  Allergen  Reactions   Penicillins Hives and Rash    Has patient had a PCN reaction causing immediate rash, facial/tongue/throat swelling, SOB or lightheadedness with hypotension: No Has patient had a PCN reaction causing severe rash involving mucus membranes or skin necrosis: No Has patient had a PCN reaction that required hospitalization: No Has patient had a PCN reaction occurring within the last 10 years: No If all of the above answers are "NO", then may proceed with Cephalosporin use.    Statins Other (See Comments)    Muscle Pain   Clindamycin/Lincomycin Nausea Only   Prochlorperazine  Other (See Comments)    Agitation, jittery  prochlorperazine        PHYSICAL EXAMINATION: Vital signs: BP (!) 141/85   Pulse 76   Temp 98.1 F (36.7 C) (Temporal)   Ht 5\' 9"  (1.753 m)   Wt 190 lb (86.2 kg)   SpO2 100%   BMI 28.06 kg/m  General: Well-developed, well-nourished, no acute distress HEENT: Sclerae are anicteric, conjunctiva pink. Oral mucosa intact Lungs: Clear Heart: Regular Abdomen: soft, nontender, nondistended, no obvious ascites, no peritoneal signs, normal bowel sounds. No organomegaly. Extremities: No edema Psychiatric: alert and oriented x3. Cooperative     ASSESSMENT:  Colon cancer screening   PLAN:  Screening colonoscopy

## 2023-09-21 NOTE — Progress Notes (Signed)
 Sedate, gd SR, tolerated procedure well, VSS, report to RN

## 2023-09-21 NOTE — Progress Notes (Signed)
 Pt's states no medical or surgical changes since previsit or office visit.

## 2023-09-21 NOTE — Op Note (Signed)
 [facility] Patient Name: Patrick Webster Procedure Date: 09/21/2023 2:30 PM MRN: [id number] Endoscopist: Murel Arlington. Elvin Hammer , MD, [id number] Age: 69 Referring MD:  Date of Birth: 04-Jul-1955 Gender: Male Account #: [id number] Procedure:                Colonoscopy with cold snare polypectomy x 1 Indications:              Average risk colon cancer screening: Reports                            previous exam elsewhere negative for neoplasia                            about 7 years ago Medicines:                Monitored Anesthesia Care Procedure:                Pre-Anesthesia Assessment:                           - Prior to the procedure, a History and Physical                            was performed, and patient medications and                            allergies were reviewed. The patient's tolerance of                            previous anesthesia was also reviewed. The risks                            and benefits of the procedure and the sedation                            options and risks were discussed with the patient.                            All questions were answered, and informed consent                            was obtained. Prior Anticoagulants: The patient has                            taken no anticoagulant or antiplatelet agents. ASA                            Grade Assessment: II - A patient with mild systemic                            disease. After reviewing the risks and benefits,                            the patient was deemed in satisfactory condition to  undergo the procedure.                           After obtaining informed consent, the colonoscope                            was passed under direct vision. Throughout the                            procedure, the patient's blood pressure, pulse, and                            oxygen saturations were monitored continuously. The                            CF [id number] #[id number]  was introduced through the anus                            and advanced to the the cecum, identified by                            appendiceal orifice and ileocecal valve. The                            ileocecal valve, appendiceal orifice, and rectum                            were photographed. The quality of the bowel                            preparation was good. The colonoscopy was performed                            without difficulty. The patient tolerated the                            procedure well. The bowel preparation used was                            SUPREP via split dose instruction. Scope In: 2:51:07 PM Scope Out: 3:06:43 PM Scope Withdrawal Time: 0 hours 12 minutes 49 seconds  Total Procedure Duration: 0 hours 15 minutes 36 seconds  Findings:                 A 3 mm polyp was found in the cecum. The polyp was                            sessile. The polyp was removed with a cold snare.                            Resection and retrieval were complete.                           The exam was otherwise without abnormality on  direct and retroflexion views. Complications:            No immediate complications. Estimated blood loss:                            None. Estimated Blood Loss:     Estimated blood loss: none. Impression:               - One 3 mm polyp in the cecum, removed with a cold                            snare. Resected and retrieved.                           - The examination was otherwise normal on direct                            and retroflexion views. Recommendation:           - Repeat colonoscopy in 7-10 years for surveillance.                           - Patient has a contact number available for                            emergencies. The signs and symptoms of potential                            delayed complications were discussed with the                            patient. Return to normal activities tomorrow.                             Written discharge instructions were provided to the                            patient.                           - Resume previous diet.                           - Continue present medications.                           - Await pathology results. Murel Arlington. Elvin Hammer, MD 09/21/2023 3:24:13 PM This report has been signed electronically.

## 2023-09-21 NOTE — Patient Instructions (Addendum)

## 2023-09-22 ENCOUNTER — Telehealth: Payer: Self-pay | Admitting: Internal Medicine

## 2023-09-22 ENCOUNTER — Telehealth: Payer: Self-pay

## 2023-09-22 NOTE — Telephone Encounter (Signed)
 Patient wife called and stated that her husband has been feeling very light headed and nauseated. Patient wife is really worried and is requesting a call back. Best contact number for patient wife is [phone]. Please advise.

## 2023-09-22 NOTE — Telephone Encounter (Signed)
 Spoke to the pt and his wife. Pt has been feeling light headed and nauseated since last night. States he was able to eat a little yesterday after the procedure and he drank a normal amount of fluids, but did not increase his intake. Pt denies abdominal pain. Wife checked his temperature because she thought he felt warm, but she states he did not have a fever. Pt did take a Phenergan  tablet that he had on hand and stated it helped a small amount but that he was still nauseous. Pt had not yet gotten out of bed today due to feeling bad. Wife checked BP while on the phone with RN and it was 116/63, HR 59. She states his normal systolic BP is 125-130's. RN encouraged pt to try to increase fluid intake to help with possible dehydration after bowel prep. Encouraged pt to also try to eat something light like a piece of toast as that might help to settle his stomach. Instructed pt and his wife that if pt does not improve or worsens that they should seek care at an urgent care or ED. They verbalized understanding.

## 2023-09-22 NOTE — Telephone Encounter (Signed)
  Follow up Call-     09/21/2023    1:48 PM  Call back number  Post procedure Call Back phone  # [phone]  Permission to leave phone message Yes     Patient questions:  Do you have a fever, pain , or abdominal swelling? No. Pain Score  0 *  Have you tolerated food without any problems? Yes.    Have you been able to return to your normal activities? Yes.    Do you have any questions about your discharge instructions: Diet   No. Medications  No. Follow up visit  No.  Do you have questions or concerns about your Care? No.  Actions: * If pain score is 4 or above: No action needed, pain <4.

## 2023-09-22 NOTE — Telephone Encounter (Signed)
 Attempted to call pt's wife to get more information on the pt. No answer. Left message that I called. Will try to call back shortly.

## 2023-09-22 NOTE — Telephone Encounter (Signed)
 Patient's wife returning phone call. States if she cannot be reach, it is okay to contact patient. Please advise, thank you.

## 2023-09-22 NOTE — Telephone Encounter (Signed)
 Noted. Nonspecific symptoms.  Colonoscopy uneventful. Agree with supportive care and time, for now. Also, agree to seek further evaluation with PCP, urgent care, or ER should things persist or worsen. Thanks for the update. Dr. Elvin Hammer

## 2023-09-28 ENCOUNTER — Ambulatory Visit: Payer: Self-pay | Admitting: Internal Medicine

## 2023-11-02 ENCOUNTER — Other Ambulatory Visit: Payer: Self-pay | Admitting: General Surgery

## 2023-11-02 DIAGNOSIS — K432 Incisional hernia without obstruction or gangrene: Secondary | ICD-10-CM

## 2023-11-05 ENCOUNTER — Ambulatory Visit
Admission: RE | Admit: 2023-11-05 | Discharge: 2023-11-05 | Disposition: A | Discharge status: Home / Self Care | Source: Ambulatory Visit | Attending: General Surgery | Admitting: General Surgery

## 2023-11-05 DIAGNOSIS — K432 Incisional hernia without obstruction or gangrene: Secondary | ICD-10-CM

## 2023-11-11 ENCOUNTER — Other Ambulatory Visit: Payer: Self-pay | Admitting: Endocrinology

## 2023-11-11 DIAGNOSIS — E049 Nontoxic goiter, unspecified: Secondary | ICD-10-CM

## 2024-01-10 ENCOUNTER — Other Ambulatory Visit: Payer: Self-pay | Admitting: *Deleted

## 2024-04-03 ENCOUNTER — Other Ambulatory Visit: Payer: Self-pay | Admitting: Endocrinology

## 2024-04-03 DIAGNOSIS — E890 Postprocedural hypothyroidism: Secondary | ICD-10-CM

## 2024-04-18 ENCOUNTER — Ambulatory Visit
Admission: RE | Admit: 2024-04-18 | Discharge: 2024-04-18 | Disposition: A | Source: Ambulatory Visit | Attending: Endocrinology | Admitting: Endocrinology

## 2024-04-18 DIAGNOSIS — E890 Postprocedural hypothyroidism: Secondary | ICD-10-CM

## 2024-04-24 ENCOUNTER — Ambulatory Visit (HOSPITAL_COMMUNITY)
Admission: RE | Admit: 2024-04-24 | Discharge: 2024-04-24 | Disposition: A | Discharge status: Home / Self Care | Source: Ambulatory Visit | Attending: Cardiology | Admitting: Cardiology

## 2024-04-24 ENCOUNTER — Ambulatory Visit: Payer: Self-pay | Admitting: Cardiology

## 2024-04-24 DIAGNOSIS — I723 Aneurysm of iliac artery: Secondary | ICD-10-CM

## 2024-04-24 DIAGNOSIS — I7121 Aneurysm of the ascending aorta, without rupture: Secondary | ICD-10-CM | POA: Diagnosis present

## 2024-04-27 ENCOUNTER — Telehealth: Payer: Self-pay | Admitting: Cardiology

## 2024-04-27 NOTE — Telephone Encounter (Signed)
 Patient would like clarification on recent AAA. He is requesting to speak with Adrien if possible.

## 2024-04-27 NOTE — Telephone Encounter (Signed)
 Spoke with pt. Pt wanted to verify the right test was done. Stated it looks like Dr Pietro had reviewed the test and said to repeat in a year. Stated if Dr Pietro felt there was a different test/that was the wrong test he would have addressed that. Pt stated understanding.
